# Patient Record
Sex: Female | Born: 1940 | Race: White | Hispanic: No | Marital: Single | Smoking: Never smoker
Health system: Southern US, Community
[De-identification: ages and names within clinical notes are randomized; demographics above are authoritative.]

## PROBLEM LIST (undated history)

## (undated) DIAGNOSIS — F419 Anxiety disorder, unspecified: Secondary | ICD-10-CM

## (undated) DIAGNOSIS — E785 Hyperlipidemia, unspecified: Secondary | ICD-10-CM

## (undated) DIAGNOSIS — H409 Unspecified glaucoma: Secondary | ICD-10-CM

## (undated) DIAGNOSIS — I2510 Atherosclerotic heart disease of native coronary artery without angina pectoris: Secondary | ICD-10-CM

## (undated) DIAGNOSIS — K219 Gastro-esophageal reflux disease without esophagitis: Secondary | ICD-10-CM

## (undated) DIAGNOSIS — C801 Malignant (primary) neoplasm, unspecified: Secondary | ICD-10-CM

## (undated) DIAGNOSIS — K589 Irritable bowel syndrome without diarrhea: Secondary | ICD-10-CM

## (undated) DIAGNOSIS — I10 Essential (primary) hypertension: Secondary | ICD-10-CM

## (undated) HISTORY — DX: Hyperlipidemia, unspecified: E78.5

## (undated) HISTORY — DX: Anxiety disorder, unspecified: F41.9

## (undated) HISTORY — PX: COLONOSCOPY: SHX174

## (undated) HISTORY — DX: Essential (primary) hypertension: I10

## (undated) HISTORY — DX: Atherosclerotic heart disease of native coronary artery without angina pectoris: I25.10

## (undated) HISTORY — DX: Gastro-esophageal reflux disease without esophagitis: K21.9

## (undated) HISTORY — DX: Unspecified glaucoma: H40.9

## (undated) HISTORY — DX: Malignant (primary) neoplasm, unspecified: C80.1

## (undated) HISTORY — DX: Irritable bowel syndrome, unspecified: K58.9

---

## 1974-12-27 HISTORY — PX: NASAL SINUS SURGERY: SHX719

## 2006-08-23 ENCOUNTER — Ambulatory Visit: Payer: Self-pay | Admitting: Gastroenterology

## 2006-08-30 ENCOUNTER — Ambulatory Visit: Payer: Self-pay | Admitting: Gastroenterology

## 2011-11-08 ENCOUNTER — Ambulatory Visit: Payer: Self-pay | Admitting: Family Medicine

## 2015-03-19 DIAGNOSIS — H40003 Preglaucoma, unspecified, bilateral: Secondary | ICD-10-CM | POA: Diagnosis not present

## 2015-10-01 ENCOUNTER — Ambulatory Visit (INDEPENDENT_AMBULATORY_CARE_PROVIDER_SITE_OTHER): Payer: Commercial Managed Care - HMO | Admitting: Family Medicine

## 2015-10-01 ENCOUNTER — Encounter: Payer: Self-pay | Admitting: Family Medicine

## 2015-10-01 VITALS — BP 156/81 | HR 66 | Temp 97.7°F | Ht 69.5 in | Wt 181.0 lb

## 2015-10-01 DIAGNOSIS — I10 Essential (primary) hypertension: Secondary | ICD-10-CM | POA: Diagnosis not present

## 2015-10-01 DIAGNOSIS — R748 Abnormal levels of other serum enzymes: Secondary | ICD-10-CM | POA: Diagnosis not present

## 2015-10-01 DIAGNOSIS — E785 Hyperlipidemia, unspecified: Secondary | ICD-10-CM

## 2015-10-01 DIAGNOSIS — C4490 Unspecified malignant neoplasm of skin, unspecified: Secondary | ICD-10-CM | POA: Diagnosis not present

## 2015-10-01 DIAGNOSIS — K219 Gastro-esophageal reflux disease without esophagitis: Secondary | ICD-10-CM | POA: Insufficient documentation

## 2015-10-01 DIAGNOSIS — H409 Unspecified glaucoma: Secondary | ICD-10-CM

## 2015-10-01 LAB — LP+ALT+AST PICCOLO, WAIVED
ALT (SGPT) Piccolo, Waived: 70 U/L — ABNORMAL HIGH (ref 10–47)
AST (SGOT) Piccolo, Waived: 85 U/L — ABNORMAL HIGH (ref 11–38)
Chol/HDL Ratio Piccolo,Waive: 4.1 mg/dL
Cholesterol Piccolo, Waived: 181 mg/dL (ref <200)
HDL CHOL PICCOLO, WAIVED: 45 mg/dL — AB (ref >59)
LDL CHOL CALC PICCOLO WAIVED: 110 mg/dL — AB (ref <100)
TRIGLYCERIDES PICCOLO,WAIVED: 134 mg/dL (ref <150)
VLDL CHOL CALC PICCOLO,WAIVE: 27 mg/dL (ref <30)

## 2015-10-01 MED ORDER — ALBUTEROL SULFATE HFA 108 (90 BASE) MCG/ACT IN AERS: 2.0000 | INHALATION_SPRAY | RESPIRATORY_TRACT | Status: DC | PRN

## 2015-10-01 MED ORDER — PRAVASTATIN SODIUM 40 MG PO TABS: 40.0000 mg | ORAL_TABLET | Freq: Every day | ORAL | Status: DC

## 2015-10-01 MED ORDER — BISOPROLOL-HYDROCHLOROTHIAZIDE 2.5-6.25 MG PO TABS: 2.0000 | ORAL_TABLET | Freq: Every day | ORAL | Status: DC

## 2015-10-01 MED ORDER — FENOFIBRATE 160 MG PO TABS: 160.0000 mg | ORAL_TABLET | Freq: Every day | ORAL | Status: DC

## 2015-10-01 NOTE — Assessment & Plan Note (Signed)
Followed at ophthalmology

## 2015-10-01 NOTE — Progress Notes (Signed)
BP 156/81 mmHg  Pulse 66  Temp(Src) 97.7 F (36.5 C)  Ht 5' 9.5" (1.765 m)  Wt 181 lb (82.101 kg)  BMI 26.35 kg/m2  SpO2 99%   Subjective:    Patient ID: Brianna Curry, female    DOB: [birth year], 74 y.o.   MRN: [id number]  HPI: Brianna Curry is a 74 y.o. female  Chief Complaint  Patient presents with  . Hyperlipidemia  . Hypertension  . needs referral for dermatology and eye  . itching spots   Patient follow-up for hypertension blood pressure checked yesterday was normal today is having a lot of stress with the granddaughter going into surgery for knees. Blood pressure ordinarily does well with no side effects takes medications faithfully.  Cholesterol doing well no complaints from Pravachol  Needs follow-up appointments for skin cancer on right cheek looked in chart both are old chart in [place] no mention of what kind of skin cancer Also needs follow-up appointment for glaucoma. These referrals will be made.  Relevant past medical, surgical, family and social history reviewed and updated as indicated. Interim medical history since our last visit reviewed. Allergies and medications reviewed and updated.  Review of Systems  Constitutional: Negative.   Respiratory: Negative.   Cardiovascular: Negative.     Per HPI unless specifically indicated above     Objective:    BP 156/81 mmHg  Pulse 66  Temp(Src) 97.7 F (36.5 C)  Ht 5' 9.5" (1.765 m)  Wt 181 lb (82.101 kg)  BMI 26.35 kg/m2  SpO2 99%  Wt Readings from Last 3 Encounters:  10/01/15 181 lb (82.101 kg)  11/11/14 178 lb (80.74 kg)    Physical Exam  Constitutional: She is oriented to person, place, and time. She appears well-developed and well-nourished. No distress.  HENT:  Head: Normocephalic and atraumatic.  Right Ear: Hearing normal.  Left Ear: Hearing normal.  Nose: Nose normal.  Eyes: Conjunctivae and lids are normal. Right eye exhibits no discharge. Left eye exhibits no discharge. No scleral  icterus.  Cardiovascular: Normal rate, regular rhythm and normal heart sounds.   Pulmonary/Chest: Effort normal and breath sounds normal. No respiratory distress.  Musculoskeletal: Normal range of motion.  Neurological: She is alert and oriented to person, place, and time.  Skin: Skin is intact. No rash noted.  Psychiatric: She has a normal mood and affect. Her speech is normal and behavior is normal. Judgment and thought content normal. Cognition and memory are normal.    No results found for this or any previous visit.    Assessment & Plan:   Problem List Items Addressed This Visit      Cardiovascular and Mediastinum   Hypertension    The current medical regimen is effective;  continue present plan and medications.       Relevant Medications   pravastatin (PRAVACHOL) 40 MG tablet   fenofibrate 160 MG tablet   bisoprolol-hydrochlorothiazide (ZIAC) 2.5-6.25 MG tablet     Other   Hyperlipidemia    The current medical regimen is effective;  continue present plan and medications.       Relevant Medications   pravastatin (PRAVACHOL) 40 MG tablet   fenofibrate 160 MG tablet   bisoprolol-hydrochlorothiazide (ZIAC) 2.5-6.25 MG tablet   Elevated liver enzymes    Patient with chronically elevated liver enzymes previous hepatitis B and C testing was negative Patient's enzymes stable but up slightly. We will recheck at physical this winter.      Glaucoma  Followed at ophthalmology      Relevant Medications   latanoprost (XALATAN) 0.005 % ophthalmic solution   Other Relevant Orders   Ambulatory referral to Ophthalmology    Other Visit Diagnoses    Essential hypertension, benign    -  Primary    Relevant Medications    pravastatin (PRAVACHOL) 40 MG tablet    fenofibrate 160 MG tablet    bisoprolol-hydrochlorothiazide (ZIAC) 2.5-6.25 MG tablet    Other Relevant Orders    LP+ALT+AST Piccolo, Waived    Basic metabolic panel    Hyperlipemia        Relevant Medications     pravastatin (PRAVACHOL) 40 MG tablet    fenofibrate 160 MG tablet    bisoprolol-hydrochlorothiazide (ZIAC) 2.5-6.25 MG tablet    Other Relevant Orders    LP+ALT+AST Piccolo, Waived    Basic metabolic panel    Skin cancer        Relevant Orders    Ambulatory referral to Dermatology        Follow up plan: Return for Physical Exam.

## 2015-10-01 NOTE — Assessment & Plan Note (Signed)
The current medical regimen is effective;  continue present plan and medications.  

## 2015-10-01 NOTE — Assessment & Plan Note (Signed)
Patient with chronically elevated liver enzymes previous hepatitis B and C testing was negative Patient's enzymes stable but up slightly. We will recheck at physical this winter.

## 2015-10-02 ENCOUNTER — Encounter: Payer: Self-pay | Admitting: Family Medicine

## 2015-10-02 LAB — BASIC METABOLIC PANEL
BUN/Creatinine Ratio: 13 (ref 11–26)
BUN: 11 mg/dL (ref 8–27)
CO2: 23 mmol/L (ref 18–29)
CREATININE: 0.87 mg/dL (ref 0.57–1.00)
Calcium: 9.6 mg/dL (ref 8.7–10.3)
Chloride: 101 mmol/L (ref 97–108)
GFR calc Af Amer: 75 mL/min/{1.73_m2} (ref >59)
GFR calc non Af Amer: 65 mL/min/{1.73_m2} (ref >59)
GLUCOSE: 113 mg/dL — AB (ref 65–99)
Potassium: 4.5 mmol/L (ref 3.5–5.2)
SODIUM: 140 mmol/L (ref 134–144)

## 2015-11-24 DIAGNOSIS — Z85828 Personal history of other malignant neoplasm of skin: Secondary | ICD-10-CM | POA: Diagnosis not present

## 2015-11-24 DIAGNOSIS — Z08 Encounter for follow-up examination after completed treatment for malignant neoplasm: Secondary | ICD-10-CM | POA: Diagnosis not present

## 2015-11-24 DIAGNOSIS — L570 Actinic keratosis: Secondary | ICD-10-CM | POA: Diagnosis not present

## 2015-11-24 DIAGNOSIS — Z1283 Encounter for screening for malignant neoplasm of skin: Secondary | ICD-10-CM | POA: Diagnosis not present

## 2016-01-14 DIAGNOSIS — H2513 Age-related nuclear cataract, bilateral: Secondary | ICD-10-CM | POA: Diagnosis not present

## 2016-03-26 ENCOUNTER — Other Ambulatory Visit: Payer: Self-pay | Admitting: Family Medicine

## 2016-04-01 ENCOUNTER — Other Ambulatory Visit: Payer: Self-pay | Admitting: Family Medicine

## 2016-05-17 ENCOUNTER — Encounter: Payer: Self-pay | Admitting: Family Medicine

## 2016-05-17 ENCOUNTER — Ambulatory Visit (INDEPENDENT_AMBULATORY_CARE_PROVIDER_SITE_OTHER): Payer: Commercial Managed Care - HMO | Admitting: Family Medicine

## 2016-05-17 VITALS — BP 133/84 | HR 89 | Temp 97.7°F | Ht 70.1 in | Wt 175.0 lb

## 2016-05-17 DIAGNOSIS — Z23 Encounter for immunization: Secondary | ICD-10-CM

## 2016-05-17 DIAGNOSIS — E785 Hyperlipidemia, unspecified: Secondary | ICD-10-CM

## 2016-05-17 DIAGNOSIS — T148 Other injury of unspecified body region: Secondary | ICD-10-CM

## 2016-05-17 DIAGNOSIS — I10 Essential (primary) hypertension: Secondary | ICD-10-CM | POA: Diagnosis not present

## 2016-05-17 DIAGNOSIS — Z0000 Encounter for general adult medical examination without abnormal findings: Secondary | ICD-10-CM | POA: Diagnosis not present

## 2016-05-17 DIAGNOSIS — W57XXXA Bitten or stung by nonvenomous insect and other nonvenomous arthropods, initial encounter: Secondary | ICD-10-CM | POA: Diagnosis not present

## 2016-05-17 LAB — MICROSCOPIC EXAMINATION

## 2016-05-17 LAB — URINALYSIS, ROUTINE W REFLEX MICROSCOPIC
Bilirubin, UA: NEGATIVE
Glucose, UA: NEGATIVE
KETONES UA: NEGATIVE
NITRITE UA: NEGATIVE
Protein, UA: NEGATIVE
RBC UA: NEGATIVE
SPEC GRAV UA: 1.02 (ref 1.005–1.030)
Urobilinogen, Ur: 0.2 mg/dL (ref 0.2–1.0)
pH, UA: 5 (ref 5.0–7.5)

## 2016-05-17 MED ORDER — BISOPROLOL-HYDROCHLOROTHIAZIDE 2.5-6.25 MG PO TABS: 1.0000 | ORAL_TABLET | Freq: Every day | ORAL | Status: DC

## 2016-05-17 MED ORDER — DOXYCYCLINE HYCLATE 100 MG PO TABS: 100.0000 mg | ORAL_TABLET | Freq: Two times a day (BID) | ORAL | Status: DC

## 2016-05-17 MED ORDER — PRAVASTATIN SODIUM 40 MG PO TABS: 40.0000 mg | ORAL_TABLET | Freq: Every day | ORAL | Status: DC

## 2016-05-17 MED ORDER — FENOFIBRATE 160 MG PO TABS: 160.0000 mg | ORAL_TABLET | Freq: Every day | ORAL | Status: DC

## 2016-05-17 NOTE — Progress Notes (Signed)
BP 133/84 mmHg  Pulse 89  Temp(Src) 97.7 F (36.5 C)  Ht 5' 10.1" (1.781 m)  Wt 175 lb (79.379 kg)  BMI 25.03 kg/m2  SpO2 96%   Subjective:    Patient ID: Brianna Curry, female    DOB: [birth year], 75 y.o.   MRN: [id number]  HPI: Brianna Curry is a 75 y.o. female  Chief Complaint  Patient presents with  . Annual Exam  Concerned about multiple tick exposures and 3 with with target lesions on her leg ends back area. This been present about 2 weeks rash is starting to fade now. doing well with cholesterol medicine and blood pressure medicine good control of blood pressure with no issues no side effects and takes medicines faithfully used  Relevant past medical, surgical, family and social history reviewed and updated as indicated. Interim medical history since our last visit reviewed. Allergies and medications reviewed and updated.  Review of Systems  Constitutional: Negative.   HENT: Negative.   Eyes: Negative.   Respiratory: Negative.   Cardiovascular: Negative.   Gastrointestinal: Negative.   Endocrine: Negative.   Genitourinary: Negative.   Musculoskeletal: Negative.   Skin: Negative.   Allergic/Immunologic: Negative.   Neurological: Negative.   Hematological: Negative.   Psychiatric/Behavioral: Negative.     Per HPI unless specifically indicated above     Objective:    BP 133/84 mmHg  Pulse 89  Temp(Src) 97.7 F (36.5 C)  Ht 5' 10.1" (1.781 m)  Wt 175 lb (79.379 kg)  BMI 25.03 kg/m2  SpO2 96%  Wt Readings from Last 3 Encounters:  05/17/16 175 lb (79.379 kg)  10/01/15 181 lb (82.101 kg)  11/11/14 178 lb (80.74 kg)    Physical Exam  Constitutional: She is oriented to person, place, and time. She appears well-developed and well-nourished.  HENT:  Head: Normocephalic and atraumatic.  Right Ear: External ear normal.  Left Ear: External ear normal.  Nose: Nose normal.  Mouth/Throat: Oropharynx is clear and moist.  Eyes: Conjunctivae and EOM are  normal. Pupils are equal, round, and reactive to light.  Neck: Normal range of motion. Neck supple. Carotid bruit is not present.  Cardiovascular: Normal rate, regular rhythm and normal heart sounds.   No murmur heard. Pulmonary/Chest: Effort normal and breath sounds normal. She exhibits no mass. Right breast exhibits no mass, no skin change and no tenderness. Left breast exhibits no mass, no skin change and no tenderness. Breasts are symmetrical.  Abdominal: Soft. Bowel sounds are normal. There is no hepatosplenomegaly.  Musculoskeletal: Normal range of motion.  Neurological: She is alert and oriented to person, place, and time.  Skin: No rash noted.  Psychiatric: She has a normal mood and affect. Her behavior is normal. Judgment and thought content normal.    Results for orders placed or performed in visit on 10/01/15  LP+ALT+AST Piccolo, [employer]  Result Value Ref Range   ALT (SGPT) Piccolo, Waived 70 (H) 10 - 47 U/L   AST (SGOT) Piccolo, Waived 85 (H) 11 - 38 U/L   Cholesterol Piccolo, Waived 181 <200 mg/dL   HDL Chol Piccolo, Waived 45 (L) >59 mg/dL   Triglycerides Piccolo,Waived 134 <150 mg/dL   Chol/HDL Ratio Piccolo,Waive 4.1 mg/dL   LDL Chol Calc Piccolo Waived 110 (H) <100 mg/dL   VLDL Chol Calc Piccolo,Waive 27 <30 mg/dL  Basic metabolic panel  Result Value Ref Range   Glucose 113 (H) 65 - 99 mg/dL   BUN 11 8 - 27 mg/dL  Creatinine, Ser 0.87 0.57 - 1.00 mg/dL   GFR calc non Af Amer 65 >59 mL/min/1.73   GFR calc Af Amer 75 >59 mL/min/1.73   BUN/Creatinine Ratio 13 11 - 26   Sodium 140 134 - 144 mmol/L   Potassium 4.5 3.5 - 5.2 mmol/L   Chloride 101 97 - 108 mmol/L   CO2 23 18 - 29 mmol/L   Calcium 9.6 8.7 - 10.3 mg/dL      Assessment & Plan:   Problem List Items Addressed This Visit      Cardiovascular and Mediastinum   Hypertension    The current medical regimen is effective;  continue present plan and medications.       Relevant Medications    bisoprolol-hydrochlorothiazide (ZIAC) 2.5-6.25 MG tablet   fenofibrate 160 MG tablet   pravastatin (PRAVACHOL) 40 MG tablet     Other   Hyperlipidemia    The current medical regimen is effective;  continue present plan and medications.       Relevant Medications   bisoprolol-hydrochlorothiazide (ZIAC) 2.5-6.25 MG tablet   fenofibrate 160 MG tablet   pravastatin (PRAVACHOL) 40 MG tablet    Other Visit Diagnoses    Immunization due    -  Primary    Relevant Orders    Pneumococcal conjugate vaccine 13-valent IM (Completed)    Routine general medical examination at a health care facility        Relevant Orders    CBC with Differential/Platelet    Comprehensive metabolic panel    Lipid Panel w/o Chol/HDL Ratio    TSH    Urinalysis, Routine w reflex microscopic (not at [facility])    Tick bite        Reviewed prevention of tick bites because of potential for Lyme's disease will treat with doxycycline for 2 weeks patient ed on sunburn        Follow up plan: Return in about 6 months (around 11/17/2016) for lipids, alt, ast, bmp.

## 2016-05-17 NOTE — Assessment & Plan Note (Signed)
The current medical regimen is effective;  continue present plan and medications.  

## 2016-05-17 NOTE — Patient Instructions (Signed)
Pneumococcal Conjugate Vaccine (PCV13)   1. Why get vaccinated?  Vaccination can protect both children and adults from pneumococcal disease.  Pneumococcal disease is caused by bacteria that can spread from person to person through close contact. It can cause ear infections, and it can also lead to more serious infections of the:  · Lungs (pneumonia),  · Blood (bacteremia), and  · Covering of the brain and spinal cord (meningitis).  Pneumococcal pneumonia is most common among adults. Pneumococcal meningitis can cause deafness and brain damage, and it kills about 1 child in 10 who get it.  Anyone can get pneumococcal disease, but children under 2 years of age and adults 65 years and older, people with certain medical conditions, and cigarette smokers are at the highest risk.  Before there was a vaccine, the [place] saw:  · more than 700 cases of meningitis,  · about 13,000 blood infections,  · about 5 million ear infections, and  · about 200 deaths  in children under 5 each year from pneumococcal disease. Since vaccine became available, severe pneumococcal disease in these children has fallen by 88%.  About 18,000 older adults die of pneumococcal disease each year in the [place].  Treatment of pneumococcal infections with penicillin and other drugs is not as effective as it used to be, because some strains of the disease have become resistant to these drugs. This makes prevention of the disease, through vaccination, even more important.  2. PCV13 vaccine  Pneumococcal conjugate vaccine (called PCV13) protects against 13 types of pneumococcal bacteria.  PCV13 is routinely given to children at 2, 4, 6, and 12-15 months of age. It is also recommended for children and adults 2 to 64 years of age with certain health conditions, and for all adults 65 years of age and older. Your doctor can give you details.  3. Some people should not get this vaccine  Anyone who has ever had a life-threatening allergic reaction  to a dose of this vaccine, to an earlier pneumococcal vaccine called PCV7, or to any vaccine containing diphtheria toxoid (for example, DTaP), should not get PCV13.  Anyone with a severe allergy to any component of PCV13 should not get the vaccine. Tell your doctor if the person being vaccinated has any severe allergies.  If the person scheduled for vaccination is not feeling well, your healthcare provider might decide to reschedule the shot on another day.  4. Risks of a vaccine reaction  With any medicine, including vaccines, there is a chance of reactions. These are usually mild and go away on their own, but serious reactions are also possible.  Problems reported following PCV13 varied by age and dose in the series. The most common problems reported among children were:  · About half became drowsy after the shot, had a temporary loss of appetite, or had redness or tenderness where the shot was given.  · About 1 out of 3 had swelling where the shot was given.  · About 1 out of 3 had a mild fever, and about 1 in 20 had a fever over 102.2°F.  · Up to about 8 out of 10 became fussy or irritable.  Adults have reported pain, redness, and swelling where the shot was given; also mild fever, fatigue, headache, chills, or muscle pain.  Young children who get PCV13 along with inactivated flu vaccine at the same time may be at increased risk for seizures caused by fever. Ask your doctor for more information.  Problems that   could happen after any vaccine:  · People sometimes faint after a medical procedure, including vaccination. Sitting or lying down for about 15 minutes can help prevent fainting, and injuries caused by a fall. Tell your doctor if you feel dizzy, or have vision changes or ringing in the ears.  · Some older children and adults get severe pain in the shoulder and have difficulty moving the arm where a shot was given. This happens very rarely.  · Any medication can cause a severe allergic reaction. Such  reactions from a vaccine are very rare, estimated at about 1 in a million doses, and would happen within a few minutes to a few hours after the vaccination.  As with any medicine, there is a very small chance of a vaccine causing a serious injury or death.  The safety of vaccines is always being monitored. For more information, visit: www.cdc.gov/vaccinesafety/  5. What if there is a serious reaction?  What should I look for?  · Look for anything that concerns you, such as signs of a severe allergic reaction, very high fever, or unusual behavior.  Signs of a severe allergic reaction can include hives, swelling of the face and throat, difficulty breathing, a fast heartbeat, dizziness, and weakness-usually within a few minutes to a few hours after the vaccination.  What should I do?  · If you think it is a severe allergic reaction or other emergency that can't wait, call 9-1-1 or get the person to the nearest hospital. Otherwise, call your doctor.  Reactions should be reported to the Vaccine Adverse Event Reporting System (VAERS). Your doctor should file this report, or you can do it yourself through the VAERS web site at www.vaers.hhs.gov, or by calling 1-800-822-7967.  VAERS does not give medical advice.  6. The National Vaccine Injury Compensation Program  The National Vaccine Injury Compensation Program (VICP) is a federal program that was created to compensate people who may have been injured by certain vaccines.  Persons who believe they may have been injured by a vaccine can learn about the program and about filing a claim by calling 1-800-338-2382 or visiting the VICP website at www.hrsa.gov/vaccinecompensation. There is a time limit to file a claim for compensation.  7. How can I learn more?  · Ask your healthcare provider. He or she can give you the vaccine package insert or suggest other sources of information.  · Call your local or state health department.  · Contact the Centers for Disease Control and  Prevention (CDC):    Call 1-800-232-4636 (1-800-CDC-INFO) or    Visit CDC's website at www.cdc.gov/vaccines  Vaccine Information Statement  PCV13 Vaccine (10/31/2014)     This information is not intended to replace advice given to you by your health care provider. Make sure you discuss any questions you have with your health care provider.     Document Released: 10/10/2006 Document Revised: 01/03/2015 Document Reviewed: 11/07/2014  Elsevier Interactive Patient Education ©2016 Elsevier Inc.

## 2016-05-18 ENCOUNTER — Encounter: Payer: Self-pay | Admitting: Family Medicine

## 2016-05-18 LAB — COMPREHENSIVE METABOLIC PANEL
ALBUMIN: 4.8 g/dL (ref 3.5–4.8)
ALT: 44 IU/L — ABNORMAL HIGH (ref 0–32)
AST: 58 IU/L — ABNORMAL HIGH (ref 0–40)
Albumin/Globulin Ratio: 1.9 (ref 1.2–2.2)
Alkaline Phosphatase: 38 IU/L — ABNORMAL LOW (ref 39–117)
BUN / CREAT RATIO: 20 (ref 12–28)
BUN: 14 mg/dL (ref 8–27)
Bilirubin Total: 0.5 mg/dL (ref 0.0–1.2)
CO2: 24 mmol/L (ref 18–29)
CREATININE: 0.71 mg/dL (ref 0.57–1.00)
Calcium: 9.8 mg/dL (ref 8.7–10.3)
Chloride: 102 mmol/L (ref 96–106)
GFR, EST AFRICAN AMERICAN: 96 mL/min/{1.73_m2} (ref >59)
GFR, EST NON AFRICAN AMERICAN: 83 mL/min/{1.73_m2} (ref >59)
GLOBULIN, TOTAL: 2.5 g/dL (ref 1.5–4.5)
GLUCOSE: 105 mg/dL — AB (ref 65–99)
Potassium: 5 mmol/L (ref 3.5–5.2)
Sodium: 143 mmol/L (ref 134–144)
TOTAL PROTEIN: 7.3 g/dL (ref 6.0–8.5)

## 2016-05-18 LAB — CBC WITH DIFFERENTIAL/PLATELET
BASOS ABS: 0 10*3/uL (ref 0.0–0.2)
Basos: 1 %
EOS (ABSOLUTE): 0.3 10*3/uL (ref 0.0–0.4)
EOS: 5 %
HEMATOCRIT: 43 % (ref 34.0–46.6)
HEMOGLOBIN: 14.3 g/dL (ref 11.1–15.9)
IMMATURE GRANS (ABS): 0 10*3/uL (ref 0.0–0.1)
Immature Granulocytes: 0 %
LYMPHS: 46 %
Lymphocytes Absolute: 2.4 10*3/uL (ref 0.7–3.1)
MCH: 29.2 pg (ref 26.6–33.0)
MCHC: 33.3 g/dL (ref 31.5–35.7)
MCV: 88 fL (ref 79–97)
MONOCYTES: 9 %
Monocytes Absolute: 0.5 10*3/uL (ref 0.1–0.9)
NEUTROS ABS: 2.1 10*3/uL (ref 1.4–7.0)
Neutrophils: 39 %
Platelets: 231 10*3/uL (ref 150–379)
RBC: 4.9 x10E6/uL (ref 3.77–5.28)
RDW: 14.2 % (ref 12.3–15.4)
WBC: 5.3 10*3/uL (ref 3.4–10.8)

## 2016-05-18 LAB — LIPID PANEL W/O CHOL/HDL RATIO
CHOLESTEROL TOTAL: 192 mg/dL (ref 100–199)
HDL: 48 mg/dL (ref >39)
LDL Calculated: 118 mg/dL — ABNORMAL HIGH (ref 0–99)
TRIGLYCERIDES: 128 mg/dL (ref 0–149)
VLDL Cholesterol Cal: 26 mg/dL (ref 5–40)

## 2016-05-18 LAB — TSH: TSH: 2.23 u[IU]/mL (ref 0.450–4.500)

## 2016-05-20 ENCOUNTER — Telehealth: Payer: Self-pay | Admitting: Family Medicine

## 2016-05-20 MED ORDER — BISOPROLOL-HYDROCHLOROTHIAZIDE 2.5-6.25 MG PO TABS: 2.0000 | ORAL_TABLET | Freq: Every day | ORAL | Status: DC

## 2016-05-20 NOTE — Telephone Encounter (Signed)
Pt called and stated that the dosage was incorrect with her bisoprolol-hydrochlorothiazide (ZIAC) 2.5-6.25 MG tablet and she would like it to be corrected and sent to  [place].

## 2016-06-14 ENCOUNTER — Encounter: Payer: Self-pay | Admitting: Family Medicine

## 2016-06-14 ENCOUNTER — Telehealth: Payer: Self-pay

## 2016-06-14 ENCOUNTER — Ambulatory Visit (INDEPENDENT_AMBULATORY_CARE_PROVIDER_SITE_OTHER): Payer: Commercial Managed Care - HMO | Admitting: Family Medicine

## 2016-06-14 VITALS — BP 147/79 | HR 78 | Temp 97.7°F | Ht 70.1 in | Wt 176.0 lb

## 2016-06-14 DIAGNOSIS — N390 Urinary tract infection, site not specified: Secondary | ICD-10-CM | POA: Diagnosis not present

## 2016-06-14 LAB — MICROSCOPIC EXAMINATION

## 2016-06-14 LAB — URINALYSIS, ROUTINE W REFLEX MICROSCOPIC
BILIRUBIN UA: NEGATIVE
GLUCOSE, UA: NEGATIVE
KETONES UA: NEGATIVE
Nitrite, UA: NEGATIVE
PH UA: 7 (ref 5.0–7.5)
Protein, UA: NEGATIVE
RBC UA: NEGATIVE
Specific Gravity, UA: 1.02 (ref 1.005–1.030)
Urobilinogen, Ur: 0.2 mg/dL (ref 0.2–1.0)

## 2016-06-14 MED ORDER — CIPROFLOXACIN HCL 250 MG PO TABS: 250.0000 mg | ORAL_TABLET | Freq: Two times a day (BID) | ORAL | Status: DC

## 2016-06-14 NOTE — Telephone Encounter (Signed)
Pt added to your schedule for 2pm. Thanks.

## 2016-06-14 NOTE — Telephone Encounter (Signed)
Patient states that she was treated for Lyme disease several weeks ago and still has a bump in the area that the tick was found. Patient also states that she thinks she has a UTI and really feels that she needs to be seen by Dr. Jeananne Rama today. I explained to the patient that Dr. Jeananne Rama did not have any appts available today but patient stated that she does not drive and that today is the only day her sister is available. Patient requested that I send a message to Dr. Jeananne Rama to see if he is willing to work her in an appt today because she had a rough night and feels so badly. Patient can be reached at [phone].

## 2016-06-14 NOTE — Progress Notes (Signed)
BP 147/79 mmHg  Pulse 78  Temp(Src) 97.7 F (36.5 C)  Ht 5' 10.1" (1.781 m)  Wt 176 lb (79.833 kg)  BMI 25.17 kg/m2  SpO2 99%   Subjective:    Patient ID: Brianna Curry, female    DOB: Oct 31, 1941, 74 y.o.   MRN: [id number]  HPI: [name] is a 75 y.o. female  Chief Complaint  Patient presents with  . Urinary Tract Infection  Patient presents with 4 day history of RLQ abdominal pain wrapping around the flank, dysuria, urinary frequency, urgency, and nausea. States she just completed a 15 day course of doxycycline for Lyme Disease, and it is common for her to have UTIs following antibiotic use. Symptoms are worsening. Has been taking AZO for several weeks in anticipation of a UTI, but symptoms are not improving. Denies fevers, chills, sweats, hematuria, bowel changes, or vomiting.   Relevant past medical, surgical, family and social history reviewed and updated as indicated. Interim medical history since our last visit reviewed. Allergies and medications reviewed and updated.  Review of Systems  Constitutional: Negative.   Respiratory: Negative.   Cardiovascular: Negative.     Per HPI unless specifically indicated above     Objective:    BP 147/79 mmHg  Pulse 78  Temp(Src) 97.7 F (36.5 C)  Ht 5' 10.1" (1.781 m)  Wt 176 lb (79.833 kg)  BMI 25.17 kg/m2  SpO2 99%  Wt Readings from Last 3 Encounters:  06/14/16 176 lb (79.833 kg)  05/17/16 175 lb (79.379 kg)  10/01/15 181 lb (82.101 kg)    Physical Exam  Constitutional: She is oriented to person, place, and time. She appears well-developed and well-nourished. No distress.  HENT:  Head: Normocephalic and atraumatic.  Right Ear: Hearing normal.  Left Ear: Hearing normal.  Nose: Nose normal.  Eyes: Conjunctivae and lids are normal. Right eye exhibits no discharge. Left eye exhibits no discharge. No scleral icterus.  Pulmonary/Chest: Effort normal. No respiratory distress.  Abdominal: There is tenderness.   Musculoskeletal: Normal range of motion.  Neurological: She is alert and oriented to person, place, and time.  Skin: Skin is intact. No rash noted.  Psychiatric: She has a normal mood and affect. Her speech is normal and behavior is normal. Judgment and thought content normal. Cognition and memory are normal.    Results for orders placed or performed in visit on 05/17/16  Microscopic Examination  Result Value Ref Range   WBC, UA 0-5 0 -  5 /hpf   RBC, UA 0-2 0 -  2 /hpf   Epithelial Cells (non renal) 0-10 0 - 10 /hpf   Mucus, UA Present Not Estab.   Bacteria, UA Few None seen/Few  CBC with Differential/Platelet  Result Value Ref Range   WBC 5.3 3.4 - 10.8 x10E3/uL   RBC 4.90 3.77 - 5.28 x10E6/uL   Hemoglobin 14.3 11.1 - 15.9 g/dL   Hematocrit 43.0 34.0 - 46.6 %   MCV 88 79 - 97 fL   MCH 29.2 26.6 - 33.0 pg   MCHC 33.3 31.5 - 35.7 g/dL   RDW 14.2 12.3 - 15.4 %   Platelets 231 150 - 379 x10E3/uL   Neutrophils 39 %   Lymphs 46 %   Monocytes 9 %   Eos 5 %   Basos 1 %   Neutrophils Absolute 2.1 1.4 - 7.0 x10E3/uL   Lymphocytes Absolute 2.4 0.7 - 3.1 x10E3/uL   Monocytes Absolute 0.5 0.1 - 0.9 x10E3/uL  EOS (ABSOLUTE) 0.3 0.0 - 0.4 x10E3/uL   Basophils Absolute 0.0 0.0 - 0.2 x10E3/uL   Immature Granulocytes 0 %   Immature Grans (Abs) 0.0 0.0 - 0.1 x10E3/uL  Comprehensive metabolic panel  Result Value Ref Range   Glucose 105 (H) 65 - 99 mg/dL   BUN 14 8 - 27 mg/dL   Creatinine, Ser 0.71 0.57 - 1.00 mg/dL   GFR calc non Af Amer 83 >59 mL/min/1.73   GFR calc Af Amer 96 >59 mL/min/1.73   BUN/Creatinine Ratio 20 12 - 28   Sodium 143 134 - 144 mmol/L   Potassium 5.0 3.5 - 5.2 mmol/L   Chloride 102 96 - 106 mmol/L   CO2 24 18 - 29 mmol/L   Calcium 9.8 8.7 - 10.3 mg/dL   Total Protein 7.3 6.0 - 8.5 g/dL   Albumin 4.8 3.5 - 4.8 g/dL   Globulin, Total 2.5 1.5 - 4.5 g/dL   Albumin/Globulin Ratio 1.9 1.2 - 2.2   Bilirubin Total 0.5 0.0 - 1.2 mg/dL   Alkaline Phosphatase 38 (L)  39 - 117 IU/L   AST 58 (H) 0 - 40 IU/L   ALT 44 (H) 0 - 32 IU/L  Lipid Panel w/o Chol/HDL Ratio  Result Value Ref Range   Cholesterol, Total 192 100 - 199 mg/dL   Triglycerides 128 0 - 149 mg/dL   HDL 48 >39 mg/dL   VLDL Cholesterol Cal 26 5 - 40 mg/dL   LDL Calculated 118 (H) 0 - 99 mg/dL  TSH  Result Value Ref Range   TSH 2.230 0.450 - 4.500 uIU/mL  Urinalysis, Routine w reflex microscopic (not at [facility])  Result Value Ref Range   Specific Gravity, UA 1.020 1.005 - 1.030   pH, UA 5.0 5.0 - 7.5   Color, UA Yellow Yellow   Appearance Ur Clear Clear   Leukocytes, UA Trace (A) Negative   Protein, UA Negative Negative/Trace   Glucose, UA Negative Negative   Ketones, UA Negative Negative   RBC, UA Negative Negative   Bilirubin, UA Negative Negative   Urobilinogen, Ur 0.2 0.2 - 1.0 mg/dL   Nitrite, UA Negative Negative   Microscopic Examination See below:       Assessment & Plan:   Problem List Items Addressed This Visit    None    Visit Diagnoses    UTI (lower urinary tract infection)    -  Primary    Discuss UTI care and treatment increase fluids water use of antibiotics Will get urine culture and sensitivity and use of Tylenol    Relevant Orders    Urinalysis, Routine w reflex microscopic (not at [facility])    Urine culture    Urinalysis, Routine w reflex microscopic (not at [facility])        Patient still on a Azo not sure if culture will be useful. Follow up plan: Return for As scheduled.

## 2016-06-16 LAB — URINE CULTURE

## 2016-11-20 ENCOUNTER — Emergency Department: Payer: Commercial Managed Care - HMO

## 2016-11-20 ENCOUNTER — Emergency Department
Admission: EM | Admit: 2016-11-20 | Discharge: 2016-11-20 | Disposition: A | Discharge status: Home / Self Care | Payer: Commercial Managed Care - HMO | Attending: Emergency Medicine | Admitting: Emergency Medicine

## 2016-11-20 ENCOUNTER — Encounter: Payer: Self-pay | Admitting: Emergency Medicine

## 2016-11-20 DIAGNOSIS — I10 Essential (primary) hypertension: Secondary | ICD-10-CM | POA: Diagnosis not present

## 2016-11-20 DIAGNOSIS — Z79899 Other long term (current) drug therapy: Secondary | ICD-10-CM | POA: Insufficient documentation

## 2016-11-20 DIAGNOSIS — Z7982 Long term (current) use of aspirin: Secondary | ICD-10-CM | POA: Diagnosis not present

## 2016-11-20 DIAGNOSIS — R609 Edema, unspecified: Secondary | ICD-10-CM

## 2016-11-20 DIAGNOSIS — M7989 Other specified soft tissue disorders: Secondary | ICD-10-CM | POA: Diagnosis not present

## 2016-11-20 DIAGNOSIS — L03113 Cellulitis of right upper limb: Secondary | ICD-10-CM | POA: Insufficient documentation

## 2016-11-20 DIAGNOSIS — S60561A Insect bite (nonvenomous) of right hand, initial encounter: Secondary | ICD-10-CM | POA: Diagnosis not present

## 2016-11-20 DIAGNOSIS — R2231 Localized swelling, mass and lump, right upper limb: Secondary | ICD-10-CM | POA: Diagnosis not present

## 2016-11-20 DIAGNOSIS — W57XXXA Bitten or stung by nonvenomous insect and other nonvenomous arthropods, initial encounter: Secondary | ICD-10-CM | POA: Diagnosis not present

## 2016-11-20 DIAGNOSIS — T7840XA Allergy, unspecified, initial encounter: Secondary | ICD-10-CM | POA: Diagnosis not present

## 2016-11-20 LAB — BASIC METABOLIC PANEL
ANION GAP: 10 (ref 5–15)
BUN: 21 mg/dL — ABNORMAL HIGH (ref 6–20)
CALCIUM: 9.7 mg/dL (ref 8.9–10.3)
CO2: 20 mmol/L — AB (ref 22–32)
CREATININE: 1.22 mg/dL — AB (ref 0.44–1.00)
Chloride: 104 mmol/L (ref 101–111)
GFR, EST AFRICAN AMERICAN: 49 mL/min — AB (ref >60)
GFR, EST NON AFRICAN AMERICAN: 42 mL/min — AB (ref >60)
GLUCOSE: 305 mg/dL — AB (ref 65–99)
Potassium: 3.7 mmol/L (ref 3.5–5.1)
Sodium: 134 mmol/L — ABNORMAL LOW (ref 135–145)

## 2016-11-20 LAB — CBC
HEMATOCRIT: 46 % (ref 35.0–47.0)
Hemoglobin: 15.7 g/dL (ref 12.0–16.0)
MCH: 30.4 pg (ref 26.0–34.0)
MCHC: 34.2 g/dL (ref 32.0–36.0)
MCV: 88.9 fL (ref 80.0–100.0)
PLATELETS: 218 10*3/uL (ref 150–440)
RBC: 5.17 MIL/uL (ref 3.80–5.20)
RDW: 13.4 % (ref 11.5–14.5)
WBC: 7.6 10*3/uL (ref 3.6–11.0)

## 2016-11-20 MED ORDER — VANCOMYCIN HCL IN DEXTROSE 1-5 GM/200ML-% IV SOLN
1000.0000 mg | Freq: Once | INTRAVENOUS | Status: AC
  Administered 2016-11-20: 1000 mg via INTRAVENOUS
  Filled 2016-11-20: qty 200

## 2016-11-20 MED ORDER — CEPHALEXIN 500 MG PO CAPS: 500.0000 mg | ORAL_CAPSULE | Freq: Three times a day (TID) | ORAL | 0 refills | Status: DC

## 2016-11-20 MED ORDER — DIPHENHYDRAMINE HCL 25 MG PO CAPS
25.0000 mg | ORAL_CAPSULE | Freq: Once | ORAL | Status: AC
  Administered 2016-11-20: 25 mg via ORAL
  Filled 2016-11-20: qty 1

## 2016-11-20 NOTE — ED Notes (Signed)
Pt is in good condition; discharge instructions reviewed, follow up care and home care reviewed; prescription medication reviewed; pt verbalized understanding; pt is ambulatory and went home with a friend.

## 2016-11-20 NOTE — ED Triage Notes (Signed)
Pt presents with swelling, pain and itching to right hand. Pt reports red streaks radiating up arm. Pt states seen at [facility] yesterday for insect bite and given steroids but the symptoms have gotten worse.

## 2016-11-20 NOTE — ED Provider Notes (Addendum)
[facility] Emergency Department Provider Note  Time seen: 9:15 PM  I have reviewed the triage vital signs and the nursing notes.   HISTORY  Chief Complaint Insect Bite and Cellulitis    HPI Brianna Curry is a 75 y.o. female with a past medical history of hypertension, hyperlipidemia, presents the emergency department for right arm swelling and redness. According to the patient she was working in her garden yesterday when she felt either a bite or a pruritic to her right hand. States she did not think much of it was hurting her a little bit overnight however she awoke around 1 or 2:00 in the morning and found that her right hand is very swollen. States the swelling and redness had continued Sr. went to urgent care today. They diagnosed with likely allergic reaction and gave her a shot of steroids. Patient states it has continued to bother her so she came to the emergency department for evaluation. Denies any history of blood clots in the past. Denies any fever or vomiting.  Past Medical History:  Diagnosis Date  . Anxiety   . GERD (gastroesophageal reflux disease)   . Glaucoma   . Hyperlipidemia   . Hypertension   . IBS (irritable bowel syndrome)     Patient Active Problem List   Diagnosis Date Noted  . Elevated liver enzymes 10/01/2015  . Glaucoma 10/01/2015  . GERD (gastroesophageal reflux disease)   . Hypertension   . Hyperlipidemia     Past Surgical History:  Procedure Laterality Date  . COLONOSCOPY    . NASAL SINUS SURGERY  1976    Prior to Admission medications   Medication Sig Start Date End Date Taking? Authorizing Provider  albuterol (PROVENTIL HFA;VENTOLIN HFA) 108 (90 BASE) MCG/ACT inhaler Inhale 2 puffs into the lungs every 4 (four) hours as needed for wheezing or shortness of breath. 10/01/15   Guadalupe Maple, MD  aspirin EC 81 MG tablet Take 81 mg by mouth daily.    Historical Provider, MD  bisoprolol-hydrochlorothiazide [facility])  2.5-6.25 MG tablet Take 2 tablets by mouth daily. 05/20/16   Guadalupe Maple, MD  ciprofloxacin (CIPRO) 250 MG tablet Take 1 tablet (250 mg total) by mouth 2 (two) times daily. 06/14/16   Guadalupe Maple, MD  fenofibrate 160 MG tablet Take 1 tablet (160 mg total) by mouth daily. 05/17/16   Guadalupe Maple, MD  latanoprost (XALATAN) 0.005 % ophthalmic solution  07/18/15   Historical Provider, MD  pravastatin (PRAVACHOL) 40 MG tablet Take 1 tablet (40 mg total) by mouth daily. 05/17/16   Guadalupe Maple, MD    Allergies  Allergen Reactions  . Sulfa Antibiotics   . Zithromax [Azithromycin]   . Penicillins Rash    No family history on file.  Social History Social History  Substance Use Topics  . Smoking status: Never Smoker  . Smokeless tobacco: Never Used  . Alcohol use No    Review of Systems Constitutional: Negative for fever. Cardiovascular: Negative for chest pain. Respiratory: Negative for shortness of breath. Gastrointestinal: Negative for abdominal pain Genitourinary: Negative for dysuria. Musculoskeletal: Right hand swelling, redness. Neurological: Negative for headache 10-point ROS otherwise negative.  ____________________________________________   PHYSICAL EXAM:  VITAL SIGNS: ED Triage Vitals [11/20/16 1845]  Enc Vitals Group     BP (!) 152/85     Pulse Rate 97     Resp 18     Temp 98.4 F (36.9 C)     Temp Source  Oral     SpO2 94 %     Weight 173 lb (78.5 kg)     Height 5\' 10"  (1.778 m)     Head Circumference      Peak Flow      Pain Score 7     Pain Loc      Pain Edu?      Excl. in [place]?     Constitutional: Alert and oriented. Well appearing and in no distress. Eyes: Normal exam ENT   Head: Normocephalic and atraumatic   Mouth/Throat: Mucous membranes are moist. Cardiovascular: Normal rate, regular rhythm. Respiratory: Normal respiratory effort without tachypnea nor retractions. Breath sounds are clear  Gastrointestinal: Soft and nontender. No  distention.   Musculoskeletal: Patient has moderate swelling to the dorsal aspect of the right hand. She does have small area on the fourth finger that appears to be a small little puncture wound versus insect bite. Patient has lymphatic streaking up the right arm. Neurovascularly intact. Is able to make a fist. Neurologic:  Normal speech and language. No gross focal neurologic deficits  Skin:  Skin is warm, dry and intact.  Psychiatric: Mood and affect are normal.   ____________________________________________   RADIOLOGY  Ultrasound shows no DVT.  ____________________________________________   INITIAL IMPRESSION / ASSESSMENT AND PLAN / ED COURSE  Pertinent labs & imaging results that were available during my care of the patient were reviewed by me and considered in my medical decision making (see chart for details).  The patient presents the emergency department with swelling and redness of the right hand, with lymphatic streaking. Patient has a small little puncture wound on the right fourth finger which could be an area of inoculation, for likely cellulitis. We'll obtain in all sounds rule out DVT. I do not suspect allergic reaction as it is localized to the right hand with lymphatic streaking. I discussed with the patient discontinuing the use of prednisone that was prescribed to her earlier today by the urgent care. We will dose IV antibiotics, obtain an ultrasound and closely monitor. Reassuringly the patient's lab shows a normal white blood cell count. Patient's blood glucose is elevated to 300 however she received an IM dose of steroids  this afternoon.  Stat negative for DVT. Patient had received vancomycin in the emergency department. We'll discharge with Keflex 3 times a day for 10 days. I discussed return precautions for worsening swelling/redness, fever or vomiting. Patient is agreeable.   __________________________________________   FINAL CLINICAL IMPRESSION(S) / ED  DIAGNOSES  Cellulitis, right upper extremity.    Harvest Dark, MD 11/20/16 [id number]    Harvest Dark, MD 11/20/16 [phone]

## 2016-11-20 NOTE — Discharge Instructions (Signed)
Please take your antibiotics as prescribed for the entire duration. Return to the emergency department for any fever, increased redness/swelling, vomiting unable to keep down your antibiotics, or any other symptom personally concerning to yourself.

## 2016-11-20 NOTE — ED Notes (Signed)
Patient transported to Ultrasound 

## 2017-01-04 ENCOUNTER — Telehealth: Payer: Self-pay | Admitting: Family Medicine

## 2017-01-04 ENCOUNTER — Other Ambulatory Visit: Payer: Self-pay | Admitting: Family Medicine

## 2017-01-04 MED ORDER — CIPROFLOXACIN HCL 250 MG PO TABS: 250.0000 mg | ORAL_TABLET | Freq: Two times a day (BID) | ORAL | 0 refills | Status: DC

## 2017-01-04 NOTE — Telephone Encounter (Signed)
Patient is hoping that Dr Jeananne Rama will be able to call her in an antibiotic for UTI.  She states she has had bouts of this in the past and has no transportation to the office.  She states she is burning and feeling the pressure to urinate but with little or no relief.    Please advise.   Thank You Santiago Glad

## 2017-01-04 NOTE — Telephone Encounter (Signed)
Routing to provider  

## 2017-01-04 NOTE — Telephone Encounter (Signed)
Rx sent to her pharmacy. If she's not better with that course of antibiotics, she will need to be seen.

## 2017-01-04 NOTE — Telephone Encounter (Signed)
Patient notified about medication and of what Dr. Durenda Age instructions were.

## 2017-02-16 DIAGNOSIS — H40003 Preglaucoma, unspecified, bilateral: Secondary | ICD-10-CM | POA: Diagnosis not present

## 2017-04-21 DIAGNOSIS — L570 Actinic keratosis: Secondary | ICD-10-CM | POA: Diagnosis not present

## 2017-04-21 DIAGNOSIS — B001 Herpesviral vesicular dermatitis: Secondary | ICD-10-CM | POA: Diagnosis not present

## 2017-05-06 ENCOUNTER — Encounter: Payer: Self-pay | Admitting: Family Medicine

## 2017-05-06 ENCOUNTER — Ambulatory Visit (INDEPENDENT_AMBULATORY_CARE_PROVIDER_SITE_OTHER): Payer: Medicare PPO | Admitting: Family Medicine

## 2017-05-06 VITALS — BP 169/84 | HR 70 | Temp 98.7°F | Wt 174.0 lb

## 2017-05-06 DIAGNOSIS — R52 Pain, unspecified: Secondary | ICD-10-CM

## 2017-05-06 DIAGNOSIS — N390 Urinary tract infection, site not specified: Secondary | ICD-10-CM | POA: Diagnosis not present

## 2017-05-06 DIAGNOSIS — R300 Dysuria: Secondary | ICD-10-CM | POA: Diagnosis not present

## 2017-05-06 MED ORDER — TIZANIDINE HCL 2 MG PO CAPS: 2.0000 mg | ORAL_CAPSULE | Freq: Three times a day (TID) | ORAL | 0 refills | Status: DC | PRN

## 2017-05-06 NOTE — Progress Notes (Signed)
   BP (!) 169/84   Pulse 70   Temp 98.7 F (37.1 C)   Wt 174 lb (78.9 kg)   SpO2 98%   BMI 24.97 kg/m    Subjective:    Patient ID: Brianna Curry, female    DOB: [birth year], 76 y.o.   MRN: [id number]  HPI: Brianna Curry is a 76 y.o. female  Chief Complaint  Patient presents with  . Generalized Body Aches    x approx 10 days. ache/hurts all on her right side only. symptoms started after taking prednisone ans Valtrex for fever blisters.   Patient presents with about 10 days of body aches on right side of body.States it feels somewhat similar to last year when she was treated for possible Lyme disease. Denies fever, chills, fatigue, new tick bites, recent travel, abnormal foods. Also with long hx of UTIs. Currently having mild dysuria but no other noticeable urinary sxs. Not trying anything OTC for sxs.   Relevant past medical, surgical, family and social history reviewed and updated as indicated. Interim medical history since our last visit reviewed. Allergies and medications reviewed and updated.  Review of Systems  Constitutional: Negative.   HENT: Negative.   Respiratory: Negative.   Cardiovascular: Negative.   Gastrointestinal: Negative.   Genitourinary: Positive for dysuria.  Musculoskeletal: Positive for arthralgias and myalgias.  Neurological: Negative.   Psychiatric/Behavioral: Negative.     Per HPI unless specifically indicated above     Objective:    BP (!) 169/84   Pulse 70   Temp 98.7 F (37.1 C)   Wt 174 lb (78.9 kg)   SpO2 98%   BMI 24.97 kg/m   Wt Readings from Last 3 Encounters:  05/06/17 174 lb (78.9 kg)  11/20/16 173 lb (78.5 kg)  06/14/16 176 lb (79.8 kg)    Physical Exam  Constitutional: She is oriented to person, place, and time. She appears well-developed and well-nourished. No distress.  HENT:  Head: Atraumatic.  Eyes: Conjunctivae are normal. No scleral icterus.  Neck: Normal range of motion. Neck supple.  Cardiovascular: Normal rate  and normal heart sounds.   Pulmonary/Chest: Effort normal. No respiratory distress.  Abdominal: Soft. Bowel sounds are normal. There is no tenderness.  Musculoskeletal: Normal range of motion.  Neurological: She is alert and oriented to person, place, and time.  Skin: Skin is warm and dry. No rash noted.  Psychiatric: She has a normal mood and affect. Her behavior is normal.  Nursing note and vitals reviewed.     Assessment & Plan:   Problem List Items Addressed This Visit    None    Visit Diagnoses    Acute lower UTI    -  Primary   U/A today with + leuks, will go ahead and treat with short course of cipro and await cx. This could be the cause of her malaise and aches.    Relevant Orders   UA/M w/rflx Culture, Routine (Completed)   Microscopic Examination (Completed)   Urine Culture, Routine (Completed)   Body aches       Will recheck tick labs today, continue OTC pain relievers, zanaflex at bedtime, and rest in the meantime. Strict precautions given with zanaflex    Relevant Orders   Lyme Ab/Western Blot Reflex   Rocky mtn spotted fvr abs pnl(IgG+IgM)       Follow up plan: Return in about 4 weeks (around 06/03/2017) for CPE.

## 2017-05-08 LAB — UA/M W/RFLX CULTURE, ROUTINE
Bilirubin, UA: NEGATIVE
Glucose, UA: NEGATIVE
Ketones, UA: NEGATIVE
Nitrite, UA: NEGATIVE
PH UA: 6 (ref 5.0–7.5)
PROTEIN UA: NEGATIVE
RBC, UA: NEGATIVE
Specific Gravity, UA: 1.015 (ref 1.005–1.030)
Urobilinogen, Ur: 0.2 mg/dL (ref 0.2–1.0)

## 2017-05-08 LAB — MICROSCOPIC EXAMINATION: BACTERIA UA: NONE SEEN

## 2017-05-08 LAB — URINE CULTURE, REFLEX

## 2017-05-09 ENCOUNTER — Other Ambulatory Visit: Payer: Self-pay | Admitting: Family Medicine

## 2017-05-09 DIAGNOSIS — E785 Hyperlipidemia, unspecified: Secondary | ICD-10-CM

## 2017-05-09 MED ORDER — NITROFURANTOIN MONOHYD MACRO 100 MG PO CAPS: 100.0000 mg | ORAL_CAPSULE | Freq: Two times a day (BID) | ORAL | 0 refills | Status: DC

## 2017-05-09 NOTE — Patient Instructions (Signed)
Follow up in 1 month for CPE 

## 2017-05-10 ENCOUNTER — Telehealth: Payer: Self-pay | Admitting: Family Medicine

## 2017-05-10 LAB — LYME AB/WESTERN BLOT REFLEX: Lyme IgG/IgM Ab: <0.91 {ISR} (ref 0.00–0.90)

## 2017-05-10 LAB — ROCKY MTN SPOTTED FVR ABS PNL(IGG+IGM)
RMSF IgG: NEGATIVE
RMSF IgM: 0.28 index (ref 0.00–0.89)

## 2017-05-10 NOTE — Telephone Encounter (Signed)
Routing to provider  

## 2017-05-10 NOTE — Telephone Encounter (Signed)
Patient called in regards to her recent blood work results. Patient wanted to know the results of her blood work from her last appointment.  Please Advise   Thank you

## 2017-05-10 NOTE — Telephone Encounter (Signed)
Routing to provider. Appt on 06/13/17 for a CPE with you.

## 2017-05-11 NOTE — Telephone Encounter (Signed)
Please let her know that her labs came back normal. Thanks! 

## 2017-05-11 NOTE — Telephone Encounter (Signed)
Called and left patient a VM letting her know labs were normal per Dr. Wynetta Emery.

## 2017-06-13 ENCOUNTER — Ambulatory Visit (INDEPENDENT_AMBULATORY_CARE_PROVIDER_SITE_OTHER): Payer: Medicare PPO | Admitting: Family Medicine

## 2017-06-13 ENCOUNTER — Encounter: Payer: Self-pay | Admitting: Family Medicine

## 2017-06-13 VITALS — BP 183/77 | HR 66 | Temp 98.0°F | Ht 69.75 in | Wt 177.0 lb

## 2017-06-13 DIAGNOSIS — I10 Essential (primary) hypertension: Secondary | ICD-10-CM

## 2017-06-13 DIAGNOSIS — E782 Mixed hyperlipidemia: Secondary | ICD-10-CM

## 2017-06-13 DIAGNOSIS — Z0000 Encounter for general adult medical examination without abnormal findings: Secondary | ICD-10-CM | POA: Diagnosis not present

## 2017-06-13 DIAGNOSIS — R748 Abnormal levels of other serum enzymes: Secondary | ICD-10-CM | POA: Diagnosis not present

## 2017-06-13 DIAGNOSIS — R0789 Other chest pain: Secondary | ICD-10-CM

## 2017-06-13 MED ORDER — ALBUTEROL SULFATE HFA 108 (90 BASE) MCG/ACT IN AERS: 2.0000 | INHALATION_SPRAY | RESPIRATORY_TRACT | 11 refills | Status: DC | PRN

## 2017-06-13 MED ORDER — BISOPROLOL-HYDROCHLOROTHIAZIDE 5-6.25 MG PO TABS: 1.0000 | ORAL_TABLET | Freq: Every day | ORAL | 1 refills | Status: DC

## 2017-06-13 NOTE — Progress Notes (Signed)
BP (!) 183/77 (BP Location: Right Arm)   Pulse 66   Temp 98 F (36.7 C)   Ht 5' 9.75" (1.772 m)   Wt 177 lb (80.3 kg)   SpO2 98%   BMI 25.58 kg/m    Subjective:    Patient ID: Brianna Curry, female    DOB: [birth year], 76 y.o.   MRN: [id number]  HPI: Brianna Curry is a 76 y.o. female presenting on 06/13/2017 for comprehensive medical examination. Current medical complaints include:see below  Having persistently elevated BP readings with ziac 2.5-6.25 BID. Taking faithfully without side effect, and denies CP, SOB, HAs. Wanting to know if she should double her dosage to two twice daily. Remains very active and eats a healthy, low salt diet.   Doing well on pravastatin, no cramping, abdominal pain, fatigue. Had mild elevation in LFTs last year so will recheck that today. Denies tylenol use or alcohol use.   Also notes some feelings of chest tightness going into right shoulder since being very ill with tick bourne illness last summer. Still very active with her garden and animals but doesn't know of any injury lately. No SOB, palpitations, weakness.   She currently lives with: alone Menopausal Symptoms: no  Functional Status Survey: Is the patient deaf or have difficulty hearing?: No Does the patient have difficulty seeing, even when wearing glasses/contacts?: No Does the patient have difficulty concentrating, remembering, or making decisions?: No Does the patient have difficulty walking or climbing stairs?: Yes (uses a cane occasionally) Does the patient have difficulty dressing or bathing?: No Does the patient have difficulty doing errands alone such as visiting a doctor's office or shopping?: No  Fall Risk  06/13/2017 05/17/2016  Falls in the past year? No No    Depression Screen Depression screen [facility] 2/9 06/13/2017 05/17/2016  Decreased Interest 0 0  Down, Depressed, Hopeless 0 0  PHQ - 2 Score 0 0    Advanced Directives Does patient have a HCPOA?    no If yes, name and  contact information:  Does patient have a living will or MOST form?  no  Past Medical History:  Past Medical History:  Diagnosis Date  . Anxiety   . GERD (gastroesophageal reflux disease)   . Glaucoma   . Hyperlipidemia   . Hypertension   . IBS (irritable bowel syndrome)     Surgical History:  Past Surgical History:  Procedure Laterality Date  . COLONOSCOPY    . NASAL SINUS SURGERY  1976    Medications:  Current Outpatient Prescriptions on File Prior to Visit  Medication Sig  . aspirin EC 81 MG tablet Take 81 mg by mouth daily.  . fenofibrate 160 MG tablet TAKE 1 TABLET EVERY DAY  . latanoprost (XALATAN) 0.005 % ophthalmic solution   . pravastatin (PRAVACHOL) 40 MG tablet TAKE 1 TABLET EVERY DAY   No current facility-administered medications on file prior to visit.     Allergies:  Allergies  Allergen Reactions  . Sulfa Antibiotics   . Zithromax [Azithromycin]   . Penicillins Rash    Social History:  Social History   Social History  . Marital status: Single    Spouse name: N/A  . Number of children: N/A  . Years of education: N/A   Occupational History  . Not on file.   Social History Main Topics  . Smoking status: Never Smoker  . Smokeless tobacco: Never Used  . Alcohol use No  . Drug use: No  . Sexual  activity: Not on file   Other Topics Concern  . Not on file   Social History Narrative  . No narrative on file   History  Smoking Status  . Never Smoker  Smokeless Tobacco  . Never Used   History  Alcohol Use No    Family History:  History reviewed. No pertinent family history.  Past medical history, surgical history, medications, allergies, family history and social history reviewed with patient today and changes made to appropriate areas of the chart.   Review of Systems - General ROS: negative Psychological ROS: negative Ophthalmic ROS: negative ENT ROS: negative Breast ROS: negative for breast lumps Respiratory ROS: no cough,  shortness of breath, or wheezing Cardiovascular ROS: positive for - chest pain and chest tightness Gastrointestinal ROS: no abdominal pain, change in bowel habits, or black or bloody stools Genito-Urinary ROS: no dysuria, trouble voiding, or hematuria Musculoskeletal ROS: negative Neurological ROS: no TIA or stroke symptoms Dermatological ROS: negative All other ROS negative except what is listed above and in the HPI.      Objective:    BP (!) 183/77 (BP Location: Right Arm)   Pulse 66   Temp 98 F (36.7 C)   Ht 5' 9.75" (1.772 m)   Wt 177 lb (80.3 kg)   SpO2 98%   BMI 25.58 kg/m   Wt Readings from Last 3 Encounters:  06/13/17 177 lb (80.3 kg)  05/06/17 174 lb (78.9 kg)  11/20/16 173 lb (78.5 kg)    Physical Exam  Constitutional: She is oriented to person, place, and time. She appears well-developed and well-nourished. No distress.  HENT:  Head: Atraumatic.  Right Ear: External ear normal.  Left Ear: External ear normal.  Nose: Nose normal.  Mouth/Throat: Oropharynx is clear and moist. No oropharyngeal exudate.  Eyes: Conjunctivae are normal. Pupils are equal, round, and reactive to light. No scleral icterus.  Neck: Normal range of motion. Neck supple. No thyromegaly present.  Cardiovascular: Normal rate, regular rhythm, normal heart sounds and intact distal pulses.   Pulmonary/Chest: Effort normal and breath sounds normal. No respiratory distress. Right breast exhibits no mass, no skin change and no tenderness. Left breast exhibits no mass, no skin change and no tenderness.  Abdominal: Soft. Bowel sounds are normal. She exhibits no mass. There is no tenderness.  Musculoskeletal: Normal range of motion. She exhibits no edema or tenderness.  Lymphadenopathy:    She has no cervical adenopathy.    She has no axillary adenopathy.  Neurological: She is alert and oriented to person, place, and time. No cranial nerve deficit.  Skin: Skin is warm and dry. No rash noted.    Psychiatric: She has a normal mood and affect. Her behavior is normal.  Nursing note and vitals reviewed.   Cognitive Testing - 6-CIT  Correct? Score   What year is it? yes 4 Yes = 0    No = 4  What month is it? yes 4 Yes = 0    No = 3  Remember:     Pia Mau, [name], [place]     What time is it? yes 4 Yes = 0    No = 3  Count backwards from 20 to 1 yes 4 Correct = 0    1 error = 2   More than 1 error = 4  Say the months of the year in reverse. yes 4 Correct = 0    1 error = 2   More than  1 error = 4  What address did I ask you to remember? yes 10 Correct = 0  1 error = 2    2 error = 4    3 error = 6    4 error = 8    All wrong = 10       TOTAL SCORE  30/28   Interpretation:  Normal  Normal (0-7) Abnormal (8-28)   Results for orders placed or performed in visit on 06/13/17  CBC with Differential/Platelet  Result Value Ref Range   WBC 4.2 3.4 - 10.8 x10E3/uL   RBC 4.90 3.77 - 5.28 x10E6/uL   Hemoglobin 14.3 11.1 - 15.9 g/dL   Hematocrit 42.4 34.0 - 46.6 %   MCV 87 79 - 97 fL   MCH 29.2 26.6 - 33.0 pg   MCHC 33.7 31.5 - 35.7 g/dL   RDW 13.8 12.3 - 15.4 %   Platelets 243 150 - 379 x10E3/uL   Neutrophils 40 Not Estab. %   Lymphs 45 Not Estab. %   Monocytes 9 Not Estab. %   Eos 5 Not Estab. %   Basos 1 Not Estab. %   Neutrophils Absolute 1.7 1.4 - 7.0 x10E3/uL   Lymphocytes Absolute 1.9 0.7 - 3.1 x10E3/uL   Monocytes Absolute 0.4 0.1 - 0.9 x10E3/uL   EOS (ABSOLUTE) 0.2 0.0 - 0.4 x10E3/uL   Basophils Absolute 0.0 0.0 - 0.2 x10E3/uL   Immature Granulocytes 0 Not Estab. %   Immature Grans (Abs) 0.0 0.0 - 0.1 x10E3/uL  Comprehensive metabolic panel  Result Value Ref Range   Glucose 120 (H) 65 - 99 mg/dL   BUN 13 8 - 27 mg/dL   Creatinine, Ser 0.93 0.57 - 1.00 mg/dL   GFR calc non Af Amer 59 (L) >59 mL/min/1.73   GFR calc Af Amer 69 >59 mL/min/1.73   BUN/Creatinine Ratio 14 12 - 28   Sodium 142 134 - 144 mmol/L   Potassium 4.3 3.5 - 5.2 mmol/L   Chloride 102 96  - 106 mmol/L   CO2 22 20 - 29 mmol/L   Calcium 9.8 8.7 - 10.3 mg/dL   Total Protein 7.5 6.0 - 8.5 g/dL   Albumin 4.8 3.5 - 4.8 g/dL   Globulin, Total 2.7 1.5 - 4.5 g/dL   Albumin/Globulin Ratio 1.8 1.2 - 2.2   Bilirubin Total 0.7 0.0 - 1.2 mg/dL   Alkaline Phosphatase 42 39 - 117 IU/L   AST 72 (H) 0 - 40 IU/L   ALT 46 (H) 0 - 32 IU/L  Lipid Panel w/o Chol/HDL Ratio  Result Value Ref Range   Cholesterol, Total 185 100 - 199 mg/dL   Triglycerides 145 0 - 149 mg/dL   HDL 38 (L) >39 mg/dL   VLDL Cholesterol Cal 29 5 - 40 mg/dL   LDL Calculated 118 (H) 0 - 99 mg/dL      Assessment & Plan:   Problem List Items Addressed This Visit      Cardiovascular and Mediastinum   Hypertension    Will increase dose of ziac. Continue to monitor routinely, alert office with persistent abnormals prior to scheduled f/u      Relevant Medications   bisoprolol-hydrochlorothiazide (ZIAC) 5-6.25 MG tablet   Other Relevant Orders   CBC with Differential/Platelet (Completed)     Other   Hyperlipidemia    Await fasting lipid results. Continue current regimen and good diet and exercise habits.       Relevant Medications   bisoprolol-hydrochlorothiazide [facility])  5-6.25 MG tablet   Other Relevant Orders   Comprehensive metabolic panel (Completed)   Lipid Panel w/o Chol/HDL Ratio (Completed)   Elevated liver enzymes    Will recheck LFTs today, if still elevated will back off on the statin and recheck at 6 month f/u. Continued avoidance of liver irritants such as alcohol and tylenol      Relevant Orders   Comprehensive metabolic panel (Completed)    Other Visit Diagnoses    Encounter for Medicare annual wellness exam    -  Primary   Chest tightness       EKG WNL, suspect msk origin of discomfort. Stretches, epsom soaks, massage, topical pain relievers prn. Return precautions given for changing or worsening sxs   Relevant Orders   EKG 12-Lead (Completed)       Preventative Services:  AAA  screening:  Health Risk Assessment and Personalized Prevention Plan: Bone Mass Measurements: Breast Cancer Screening: CVD Screening:  Cervical Cancer Screening: Colon Cancer Screening:  Depression Screening:  Diabetes Screening:  Glaucoma Screening:  Hepatitis B vaccine: Hepatitis C screening:  HIV Screening: Flu Vaccine: Lung cancer Screening: Obesity Screening:  Pneumonia Vaccines (2): STI Screening:  Follow up plan: Return in about 6 months (around 12/13/2017) for BP, Cholesterol .   LABORATORY TESTING:  - Pap smear: not applicable  IMMUNIZATIONS:   - Tdap: Tetanus vaccination status reviewed: last tetanus booster within 10 years. - Influenza: Postponed to flu season - Pneumovax: Up to date - Prevnar: Up to date - Zostavax vaccine: Up to date  SCREENING: -Mammogram: Not applicable  - Colonoscopy: Up to date  - Bone Density: Up to date   PATIENT COUNSELING:   Advised to take 1 mg of folate supplement per day if capable of pregnancy.   Sexuality: Discussed sexually transmitted diseases, partner selection, use of condoms, avoidance of unintended pregnancy  and contraceptive alternatives.   Advised to avoid cigarette smoking.  I discussed with the patient that most people either abstain from alcohol or drink within safe limits (<=14/week and <=4 drinks/occasion for males, <=7/weeks and <= 3 drinks/occasion for females) and that the risk for alcohol disorders and other health effects rises proportionally with the number of drinks per week and how often a drinker exceeds daily limits.  Discussed cessation/primary prevention of drug use and availability of treatment for abuse.   Diet: Encouraged to adjust caloric intake to maintain  or achieve ideal body weight, to reduce intake of dietary saturated fat and total fat, to limit sodium intake by avoiding high sodium foods and not adding table salt, and to maintain adequate dietary potassium and calcium preferably from fresh  fruits, vegetables, and low-fat dairy products.    stressed the importance of regular exercise  Injury prevention: Discussed safety belts, safety helmets, smoke detector, smoking near bedding or upholstery.   Dental health: Discussed importance of regular tooth brushing, flossing, and dental visits.    NEXT PREVENTATIVE PHYSICAL DUE IN 1 YEAR. Return in about 6 months (around 12/13/2017) for BP, Cholesterol .

## 2017-06-14 LAB — CBC WITH DIFFERENTIAL/PLATELET
Basophils Absolute: 0 10*3/uL (ref 0.0–0.2)
Basos: 1 %
EOS (ABSOLUTE): 0.2 10*3/uL (ref 0.0–0.4)
EOS: 5 %
HEMATOCRIT: 42.4 % (ref 34.0–46.6)
Hemoglobin: 14.3 g/dL (ref 11.1–15.9)
IMMATURE GRANS (ABS): 0 10*3/uL (ref 0.0–0.1)
IMMATURE GRANULOCYTES: 0 %
LYMPHS ABS: 1.9 10*3/uL (ref 0.7–3.1)
Lymphs: 45 %
MCH: 29.2 pg (ref 26.6–33.0)
MCHC: 33.7 g/dL (ref 31.5–35.7)
MCV: 87 fL (ref 79–97)
Monocytes Absolute: 0.4 10*3/uL (ref 0.1–0.9)
Monocytes: 9 %
NEUTROS PCT: 40 %
Neutrophils Absolute: 1.7 10*3/uL (ref 1.4–7.0)
Platelets: 243 10*3/uL (ref 150–379)
RBC: 4.9 x10E6/uL (ref 3.77–5.28)
RDW: 13.8 % (ref 12.3–15.4)
WBC: 4.2 10*3/uL (ref 3.4–10.8)

## 2017-06-14 LAB — COMPREHENSIVE METABOLIC PANEL
ALBUMIN: 4.8 g/dL (ref 3.5–4.8)
ALT: 46 IU/L — ABNORMAL HIGH (ref 0–32)
AST: 72 IU/L — ABNORMAL HIGH (ref 0–40)
Albumin/Globulin Ratio: 1.8 (ref 1.2–2.2)
Alkaline Phosphatase: 42 IU/L (ref 39–117)
BUN / CREAT RATIO: 14 (ref 12–28)
BUN: 13 mg/dL (ref 8–27)
Bilirubin Total: 0.7 mg/dL (ref 0.0–1.2)
CALCIUM: 9.8 mg/dL (ref 8.7–10.3)
CO2: 22 mmol/L (ref 20–29)
CREATININE: 0.93 mg/dL (ref 0.57–1.00)
Chloride: 102 mmol/L (ref 96–106)
GFR calc Af Amer: 69 mL/min/{1.73_m2} (ref >59)
GFR, EST NON AFRICAN AMERICAN: 59 mL/min/{1.73_m2} — AB (ref >59)
GLOBULIN, TOTAL: 2.7 g/dL (ref 1.5–4.5)
GLUCOSE: 120 mg/dL — AB (ref 65–99)
Potassium: 4.3 mmol/L (ref 3.5–5.2)
SODIUM: 142 mmol/L (ref 134–144)
Total Protein: 7.5 g/dL (ref 6.0–8.5)

## 2017-06-14 LAB — LIPID PANEL W/O CHOL/HDL RATIO
Cholesterol, Total: 185 mg/dL (ref 100–199)
HDL: 38 mg/dL — ABNORMAL LOW (ref >39)
LDL CALC: 118 mg/dL — AB (ref 0–99)
Triglycerides: 145 mg/dL (ref 0–149)
VLDL CHOLESTEROL CAL: 29 mg/dL (ref 5–40)

## 2017-06-14 NOTE — Assessment & Plan Note (Signed)
Await fasting lipid results. Continue current regimen and good diet and exercise habits.

## 2017-06-14 NOTE — Assessment & Plan Note (Signed)
Will increase dose of ziac. Continue to monitor routinely, alert office with persistent abnormals prior to scheduled f/u

## 2017-06-14 NOTE — Assessment & Plan Note (Signed)
Will recheck LFTs today, if still elevated will back off on the statin and recheck at 6 month f/u. Continued avoidance of liver irritants such as alcohol and tylenol

## 2017-06-15 ENCOUNTER — Other Ambulatory Visit: Payer: Self-pay | Admitting: Family Medicine

## 2017-06-15 NOTE — Telephone Encounter (Signed)
Her liver enzymes are still elevated, and the pravastatin doesn't seem to be budging her cholesterol level much. Let's take a holiday from the statin and recheck her fasting in 3 months. Thanks

## 2017-06-15 NOTE — Telephone Encounter (Signed)
Attempted to call patient, phone line was busy.

## 2017-06-17 MED ORDER — BISOPROLOL-HYDROCHLOROTHIAZIDE 5-6.25 MG PO TABS: 1.0000 | ORAL_TABLET | Freq: Two times a day (BID) | ORAL | 1 refills | Status: DC

## 2017-06-17 NOTE — Telephone Encounter (Signed)
PT is taking 5 mg BID, does need new RX as RX is written for once daily. Pt states she is no longer having her headaches, but she has not actually checked BP.

## 2017-06-17 NOTE — Telephone Encounter (Signed)
Patient returned my call. I let her know what Apolonio Schneiders said about her labs and statin medication. Patient verbalized understanding. Patient also asked on the phone about her BP medication, bisoprolol/hctz. Patient states that she thought that she was supposed to start taking 10 mg daily but the prescription is for the 5 mg. Patient states that she started taking 2 tablets of the medication on Monday to equal the 10 mg and states that she has been feeling better since doing this. Patient wants to know if she could have the prescription changed and resent to [facility].

## 2017-06-17 NOTE — Telephone Encounter (Signed)
She was to be taking 5 mg twice daily on the ziac. If her BPs are still elevated on that, we can discuss increasing her medication. She was previously on 2.5 BID.

## 2017-06-17 NOTE — Telephone Encounter (Signed)
Called and left patient a VM asking for her to please return our call.  

## 2017-07-13 ENCOUNTER — Other Ambulatory Visit: Payer: Self-pay | Admitting: Family Medicine

## 2017-07-13 DIAGNOSIS — E785 Hyperlipidemia, unspecified: Secondary | ICD-10-CM

## 2017-07-26 ENCOUNTER — Emergency Department
Admission: EM | Admit: 2017-07-26 | Discharge: 2017-07-26 | Disposition: A | Discharge status: Home / Self Care | Payer: Medicare PPO | Attending: Emergency Medicine | Admitting: Emergency Medicine

## 2017-07-26 ENCOUNTER — Encounter: Payer: Self-pay | Admitting: Emergency Medicine

## 2017-07-26 DIAGNOSIS — Z79899 Other long term (current) drug therapy: Secondary | ICD-10-CM | POA: Diagnosis not present

## 2017-07-26 DIAGNOSIS — Z7902 Long term (current) use of antithrombotics/antiplatelets: Secondary | ICD-10-CM | POA: Insufficient documentation

## 2017-07-26 DIAGNOSIS — H6123 Impacted cerumen, bilateral: Secondary | ICD-10-CM | POA: Insufficient documentation

## 2017-07-26 DIAGNOSIS — Z7982 Long term (current) use of aspirin: Secondary | ICD-10-CM | POA: Insufficient documentation

## 2017-07-26 DIAGNOSIS — I10 Essential (primary) hypertension: Secondary | ICD-10-CM | POA: Diagnosis not present

## 2017-07-26 DIAGNOSIS — H9203 Otalgia, bilateral: Secondary | ICD-10-CM | POA: Diagnosis present

## 2017-07-26 NOTE — ED Notes (Signed)
Irrigated with approx 20 mls of warm water   No change

## 2017-07-26 NOTE — Discharge Instructions (Signed)
Continue earwax softener for 2 more days. Also advised over-the-counter Sudafed and 30 mg. Return in 2 days if no improvement.

## 2017-07-26 NOTE — ED Triage Notes (Signed)
Patient complaining of "fullness" in both ears, worse on right.  States this started on Saturday and is gradually getting worse.  Patient states she feels her pulse in her ear.  History of wax buildup in her ears.

## 2017-07-26 NOTE — ED Notes (Signed)
See triage note  States she developed some fullness to both ears  Was seen by PCP's office  They told her she had wax build up but did not irrigate ears  Unable to see [facility] until the middle of the month.  Right is worse than left

## 2017-07-26 NOTE — ED Provider Notes (Signed)
[facility] Emergency Department Provider Note   ____________________________________________   First MD Initiated Contact with Patient 07/26/17 1003     (approximate)  I have reviewed the triage vital signs and the nursing notes.   HISTORY  Chief Complaint Otalgia    HPI Brianna Curry is a 76 y.o. female patient complaining of fourth years however the right is worse than the left. She also complaining of decreased hearing loss. Patient states she felt this pressure on her eardrum. Patient has a history of impaction. Patient denies URI signs and symptoms. Patient denies vertigo. No palliative measures for complaint.Patient rates pain as a 10 over 10. Patient describes her pain as "pressure".   Past Medical History:  Diagnosis Date  . Anxiety   . GERD (gastroesophageal reflux disease)   . Glaucoma   . Hyperlipidemia   . Hypertension   . IBS (irritable bowel syndrome)     Patient Active Problem List   Diagnosis Date Noted  . Elevated liver enzymes 10/01/2015  . Glaucoma 10/01/2015  . GERD (gastroesophageal reflux disease)   . Hypertension   . Hyperlipidemia     Past Surgical History:  Procedure Laterality Date  . COLONOSCOPY    . NASAL SINUS SURGERY  1976    Prior to Admission medications   Medication Sig Start Date End Date Taking? Authorizing Provider  albuterol (PROVENTIL HFA;VENTOLIN HFA) 108 (90 Base) MCG/ACT inhaler Inhale 2 puffs into the lungs every 4 (four) hours as needed for wheezing or shortness of breath. 06/13/17   Volney American, PA-C  aspirin EC 81 MG tablet Take 81 mg by mouth daily.    [provider]  bisoprolol-hydrochlorothiazide (ZIAC) 5-6.25 MG tablet Take 1 tablet by mouth 2 (two) times daily. 06/17/17   Volney American, PA-C  Docusate Calcium (STOOL SOFTENER PO) Take by mouth daily.    [provider]  fenofibrate 160 MG tablet TAKE 1 TABLET EVERY DAY 07/14/17   Johnson, Megan P, DO    FIBER PO Take by mouth daily.    [provider]  latanoprost (XALATAN) 0.005 % ophthalmic solution  07/18/15   [provider]  Multiple Vitamin (MULTIVITAMIN) tablet Take 1 tablet by mouth daily.    [provider]  pravastatin (PRAVACHOL) 40 MG tablet TAKE 1 TABLET EVERY DAY 07/14/17   Park Liter P, DO    Allergies Sulfa antibiotics; Zithromax [azithromycin]; and Penicillins  No family history on file.  Social History Social History  Substance Use Topics  . Smoking status: Never Smoker  . Smokeless tobacco: Never Used  . Alcohol use No    Review of Systems  Constitutional: No fever/chills Eyes: No visual changes. ENT: No sore throat. Cardiovascular: Denies chest pain. Respiratory: Denies shortness of breath. Gastrointestinal: No abdominal pain.  No nausea, no vomiting.  No diarrhea.  No constipation. Genitourinary: Negative for dysuria. Musculoskeletal: Negative for back pain. Skin: Negative for rash. Neurological: Negative for headaches, focal weakness or numbness. Psychiatric:Anxiety Endocrine:Hyperlipidemia and hypertension Hematological/Lymphatic: Allergic/Immunilogical: See medication list  ____________________________________________   PHYSICAL EXAM:  VITAL SIGNS: ED Triage Vitals  Enc Vitals Group     BP 07/26/17 0939 (!) 153/73     Pulse Rate 07/26/17 0939 76     Resp 07/26/17 0939 16     Temp 07/26/17 0939 98.6 F (37 C)     Temp src --      SpO2 07/26/17 0939 95 %     Weight 07/26/17 0940 174 lb (  78.9 kg)     Height 07/26/17 0940 5\' 10"  (1.778 m)     Head Circumference --      Peak Flow --      Pain Score 07/26/17 0938 10     Pain Loc --      Pain Edu? --      Excl. in [place]? --    Constitutional: Alert and oriented. Well appearing and in no acute distress. EARS: TM not visible secondary to impaction Mouth/Throat: Mucous membranes are moist.  Oropharynx non-erythematous. Neck: No stridor.  No cervical spine  tenderness to palpation. Cardiovascular: Normal rate, regular rhythm. Grossly normal heart sounds.  Good peripheral circulation. Respiratory: Normal respiratory effort.  No retractions. Lungs CTAB. Neurologic:  Normal speech and language. No gross focal neurologic deficits are appreciated. No gait instability. Skin:  Skin is warm, dry and intact. No rash noted. Psychiatric: Mood and affect are normal. Speech and behavior are normal.  ____________________________________________   LABS (all labs ordered are listed, but only abnormal results are displayed)  Labs Reviewed - No data to display ____________________________________________  EKG   ____________________________________________  RADIOLOGY  No results found.  ____________________________________________   PROCEDURES  Procedure(s) performed: None  Procedures  Critical Care performed: No  ____________________________________________   INITIAL IMPRESSION / ASSESSMENT AND PLAN / ED COURSE  Pertinent labs & imaging results that were available during my care of the patient were reviewed by me and considered in my medical decision making (see chart for details).  Bilateral cerumen impaction. Was able to remove 2 large plugs from right ear with irrigation. Patient continues complaining of decreased hearing of the right ear. Left ear was completely irrigated removing impaction. Patient advised to continue using over-the-counter ear wax softener and a mild decongestant. Advised return back in 2 days to the ED or PCP for reevaluation.      ____________________________________________   FINAL CLINICAL IMPRESSION(S) / ED DIAGNOSES  Final diagnoses:  Bilateral impacted cerumen      NEW MEDICATIONS STARTED DURING THIS VISIT:  New Prescriptions   No medications on file     Note:  This document was prepared using Dragon voice recognition software and may include unintentional dictation errors.    Sable Feil, PA-C 07/26/17 1115    Schaevitz, Randall An, MD 07/26/17 [phone]

## 2017-08-03 DIAGNOSIS — H6123 Impacted cerumen, bilateral: Secondary | ICD-10-CM | POA: Diagnosis not present

## 2017-08-03 DIAGNOSIS — H903 Sensorineural hearing loss, bilateral: Secondary | ICD-10-CM | POA: Diagnosis not present

## 2017-09-05 ENCOUNTER — Encounter: Payer: Self-pay | Admitting: Family Medicine

## 2017-09-05 ENCOUNTER — Ambulatory Visit (INDEPENDENT_AMBULATORY_CARE_PROVIDER_SITE_OTHER): Payer: Medicare PPO | Admitting: Family Medicine

## 2017-09-05 DIAGNOSIS — I10 Essential (primary) hypertension: Secondary | ICD-10-CM | POA: Diagnosis not present

## 2017-09-05 DIAGNOSIS — E785 Hyperlipidemia, unspecified: Secondary | ICD-10-CM | POA: Diagnosis not present

## 2017-09-05 DIAGNOSIS — R238 Other skin changes: Secondary | ICD-10-CM

## 2017-09-05 MED ORDER — MUPIROCIN 2 % EX OINT: 1.0000 "application " | TOPICAL_OINTMENT | Freq: Two times a day (BID) | CUTANEOUS | 0 refills | Status: DC

## 2017-09-05 MED ORDER — FENOFIBRATE 160 MG PO TABS: 160.0000 mg | ORAL_TABLET | Freq: Every day | ORAL | 2 refills | Status: DC

## 2017-09-05 NOTE — Assessment & Plan Note (Signed)
Bulla I&D performed will give Bactroban ointment to apply if not improving will need to further evaluate may need oral antibiotics.

## 2017-09-05 NOTE — Assessment & Plan Note (Signed)
Blood pressure elevated today but patient under stress will just observe blood pressure not make any medication change.

## 2017-09-05 NOTE — Progress Notes (Signed)
BP (!) 146/80   Pulse 69   Wt 174 lb (78.9 kg)   SpO2 98%   BMI 24.97 kg/m    Subjective:    Patient ID: Brianna Curry, female    DOB: [birth year], 76 y.o.   MRN: [id number]  HPI: Brianna Curry is a 76 y.o. female  Chief Complaint  Patient presents with  . Foot Injury    Right foot. Last time pt was treate w/ keflex 300 tid  Patient with unknown injury to her right foot Now with marked illness formation no real itching but redness starting develop secondary infection. Bulla was opened and drained after alcohol prep and informed consent. Wound was dressed. Old chart was reviewed both the emergency room and dermatology patient is intolerant of prednisone took IV antibiotics last time but there is no secondary sign of infection at this time.  Relevant past medical, surgical, family and social history reviewed and updated as indicated. Interim medical history since our last visit reviewed. Allergies and medications reviewed and updated.  Review of Systems  Constitutional: Negative.   Respiratory: Negative.   Cardiovascular: Negative.     Per HPI unless specifically indicated above     Objective:    BP (!) 146/80   Pulse 69   Wt 174 lb (78.9 kg)   SpO2 98%   BMI 24.97 kg/m   Wt Readings from Last 3 Encounters:  09/05/17 174 lb (78.9 kg)  07/26/17 174 lb (78.9 kg)  06/13/17 177 lb (80.3 kg)    Physical Exam  Constitutional: She is oriented to person, place, and time. She appears well-developed and well-nourished.  HENT:  Head: Normocephalic and atraumatic.  Eyes: Conjunctivae and EOM are normal.  Neck: Normal range of motion.  Cardiovascular: Normal rate, regular rhythm and normal heart sounds.   Pulmonary/Chest: Effort normal and breath sounds normal.  Musculoskeletal: Normal range of motion.  Neurological: She is alert and oriented to person, place, and time.  Skin: No erythema.  Bullous formation right anterior foot incision and drainage made as noted above    Psychiatric: She has a normal mood and affect. Her behavior is normal. Judgment and thought content normal.    Results for orders placed or performed in visit on 06/13/17  CBC with Differential/Platelet  Result Value Ref Range   WBC 4.2 3.4 - 10.8 x10E3/uL   RBC 4.90 3.77 - 5.28 x10E6/uL   Hemoglobin 14.3 11.1 - 15.9 g/dL   Hematocrit 42.4 34.0 - 46.6 %   MCV 87 79 - 97 fL   MCH 29.2 26.6 - 33.0 pg   MCHC 33.7 31.5 - 35.7 g/dL   RDW 13.8 12.3 - 15.4 %   Platelets 243 150 - 379 x10E3/uL   Neutrophils 40 Not Estab. %   Lymphs 45 Not Estab. %   Monocytes 9 Not Estab. %   Eos 5 Not Estab. %   Basos 1 Not Estab. %   Neutrophils Absolute 1.7 1.4 - 7.0 x10E3/uL   Lymphocytes Absolute 1.9 0.7 - 3.1 x10E3/uL   Monocytes Absolute 0.4 0.1 - 0.9 x10E3/uL   EOS (ABSOLUTE) 0.2 0.0 - 0.4 x10E3/uL   Basophils Absolute 0.0 0.0 - 0.2 x10E3/uL   Immature Granulocytes 0 Not Estab. %   Immature Grans (Abs) 0.0 0.0 - 0.1 x10E3/uL  Comprehensive metabolic panel  Result Value Ref Range   Glucose 120 (H) 65 - 99 mg/dL   BUN 13 8 - 27 mg/dL   Creatinine, Ser 0.93 0.57 -  1.00 mg/dL   GFR calc non Af Amer 59 (L) >59 mL/min/1.73   GFR calc Af Amer 69 >59 mL/min/1.73   BUN/Creatinine Ratio 14 12 - 28   Sodium 142 134 - 144 mmol/L   Potassium 4.3 3.5 - 5.2 mmol/L   Chloride 102 96 - 106 mmol/L   CO2 22 20 - 29 mmol/L   Calcium 9.8 8.7 - 10.3 mg/dL   Total Protein 7.5 6.0 - 8.5 g/dL   Albumin 4.8 3.5 - 4.8 g/dL   Globulin, Total 2.7 1.5 - 4.5 g/dL   Albumin/Globulin Ratio 1.8 1.2 - 2.2   Bilirubin Total 0.7 0.0 - 1.2 mg/dL   Alkaline Phosphatase 42 39 - 117 IU/L   AST 72 (H) 0 - 40 IU/L   ALT 46 (H) 0 - 32 IU/L  Lipid Panel w/o Chol/HDL Ratio  Result Value Ref Range   Cholesterol, Total 185 100 - 199 mg/dL   Triglycerides 145 0 - 149 mg/dL   HDL 38 (L) >39 mg/dL   VLDL Cholesterol Cal 29 5 - 40 mg/dL   LDL Calculated 118 (H) 0 - 99 mg/dL      Assessment & Plan:   Problem List Items  Addressed This Visit      Cardiovascular and Mediastinum   Hypertension    Blood pressure elevated today but patient under stress will just observe blood pressure not make any medication change.      Relevant Medications   fenofibrate 160 MG tablet     Other   Hyperlipidemia   Relevant Medications   fenofibrate 160 MG tablet   Skin bulla    Bulla I&D performed will give Bactroban ointment to apply if not improving will need to further evaluate may need oral antibiotics.          Follow up plan: Return if symptoms worsen or fail to improve, for As scheduled.

## 2017-09-13 ENCOUNTER — Telehealth: Payer: Self-pay | Admitting: Family Medicine

## 2017-09-13 NOTE — Telephone Encounter (Signed)
Needs appointment

## 2017-09-13 NOTE — Telephone Encounter (Signed)
Routing to provider  

## 2017-09-13 NOTE — Telephone Encounter (Signed)
Patient scheduled for 9/19 @ 10:45 with Brianna Curry

## 2017-09-13 NOTE — Telephone Encounter (Signed)
Patient was seen last Monday by Dr Jeananne Rama for some type of bite on her foot. Dr Jeananne Rama gave her a cream and she also has been soaking it in epsom salt water but she is continuing to have the issue.  She was supposed to call back in a week to let Dr Jeananne Rama know if she was any better.  She continues to have the symptoms and feels she may have some type of infection in her blood because she is tired with this also.  She still has some of the cream left but thinks she may need an antibiotic to help also.    Please advise.  Thank you  [phone]

## 2017-09-13 NOTE — Telephone Encounter (Signed)
Santiago Glad please get patient scheduled.

## 2017-09-14 ENCOUNTER — Ambulatory Visit: Payer: Medicare PPO | Admitting: Family Medicine

## 2017-09-23 ENCOUNTER — Encounter: Payer: Self-pay | Admitting: Family Medicine

## 2017-09-23 ENCOUNTER — Ambulatory Visit (INDEPENDENT_AMBULATORY_CARE_PROVIDER_SITE_OTHER): Payer: Medicare PPO | Admitting: Family Medicine

## 2017-09-23 VITALS — BP 179/81 | HR 73 | Wt 170.0 lb

## 2017-09-23 DIAGNOSIS — K625 Hemorrhage of anus and rectum: Secondary | ICD-10-CM

## 2017-09-23 DIAGNOSIS — R748 Abnormal levels of other serum enzymes: Secondary | ICD-10-CM | POA: Diagnosis not present

## 2017-09-23 LAB — CBC WITH DIFFERENTIAL/PLATELET
HEMOGLOBIN: 16.2 g/dL — AB (ref 11.1–15.9)
Hematocrit: 49 % — ABNORMAL HIGH (ref 34.0–46.6)
LYMPHS ABS: 1.4 10*3/uL (ref 0.7–3.1)
Lymphs: 33 %
MCH: 29.4 pg (ref 26.6–33.0)
MCHC: 33.1 g/dL (ref 31.5–35.7)
MCV: 89 fL (ref 79–97)
MID (Absolute): 0.3 10*3/uL (ref 0.1–1.6)
MID: 7 %
NEUTROS ABS: 2.4 10*3/uL (ref 1.4–7.0)
NEUTROS PCT: 61 %
Platelets: 258 10*3/uL (ref 150–379)
RBC: 5.51 x10E6/uL — ABNORMAL HIGH (ref 3.77–5.28)
RDW: 14.2 % (ref 12.3–15.4)
WBC: 4.1 10*3/uL (ref 3.4–10.8)

## 2017-09-23 MED ORDER — HYDROCORTISONE 2.5 % RE CREA: 1.0000 "application " | TOPICAL_CREAM | Freq: Two times a day (BID) | RECTAL | 3 refills | Status: DC

## 2017-09-23 NOTE — Progress Notes (Signed)
BP (!) 179/81   Pulse 73   Wt 170 lb (77.1 kg)   SpO2 97%   BMI 24.39 kg/m    Subjective:    Patient ID: Brianna Curry, female    DOB: May 10, 1941, 76 y.o.   MRN: [id number]  HPI: Brianna Curry is a 76 y.o. female  Chief Complaint  Patient presents with  . Rectal Bleeding    Started last night. Continous. Kotex x 3 times.    Patient presents with bright red rectal bleeding since yesterday evening and painful BMs. Stools have been regular and soft the past week, but does have a long hx of issues with constipation. No known hx previously of hemorrhoids, fissures, or other anorectal issues. Bleeding has slowed down this morning, but states overnight she went through 3 pads. Prep H helping some. Denies fever, chills, abdominal pain, weakness, CP, SOB.   Relevant past medical, surgical, family and social history reviewed and updated as indicated. Interim medical history since our last visit reviewed. Allergies and medications reviewed and updated.  Review of Systems  Constitutional: Negative.   Respiratory: Negative.   Cardiovascular: Negative.   Gastrointestinal: Positive for blood in stool and rectal pain.  Genitourinary: Negative.  Negative for hematuria.  Musculoskeletal: Negative.   Neurological: Negative.   Psychiatric/Behavioral: Negative.    Per HPI unless specifically indicated above     Objective:    BP (!) 179/81   Pulse 73   Wt 170 lb (77.1 kg)   SpO2 97%   BMI 24.39 kg/m   Wt Readings from Last 3 Encounters:  09/23/17 170 lb (77.1 kg)  09/05/17 174 lb (78.9 kg)  07/26/17 174 lb (78.9 kg)    Physical Exam  Constitutional: She is oriented to person, place, and time. She appears well-developed and well-nourished. No distress.  HENT:  Head: Atraumatic.  Eyes: Conjunctivae are normal. No scleral icterus.  Neck: Normal range of motion. Neck supple.  Cardiovascular: Normal rate and normal heart sounds.   Pulmonary/Chest: Effort normal and breath sounds  normal. No respiratory distress.  Abdominal: Soft. Bowel sounds are normal. There is no tenderness. There is no guarding.  Genitourinary:     Genitourinary Comments: Anal area too tender and inflamed for internal exam today  Musculoskeletal: Normal range of motion.  Neurological: She is alert and oriented to person, place, and time.  Skin: Skin is warm and dry. No erythema. No pallor.  Psychiatric: She has a normal mood and affect. Her behavior is normal.  Nursing note and vitals reviewed.   Results for orders placed or performed in visit on 09/23/17  CBC With Differential/Platelet  Result Value Ref Range   WBC 4.1 3.4 - 10.8 x10E3/uL   RBC 5.51 (H) 3.77 - 5.28 x10E6/uL   Hemoglobin 16.2 (H) 11.1 - 15.9 g/dL   Hematocrit 49.0 (H) 34.0 - 46.6 %   MCV 89 79 - 97 fL   MCH 29.4 26.6 - 33.0 pg   MCHC 33.1 31.5 - 35.7 g/dL   RDW 14.2 12.3 - 15.4 %   Platelets 258 150 - 379 x10E3/uL   Neutrophils 61 Not Estab. %   Lymphs 33 Not Estab. %   MID 7 Not Estab. %   Neutrophils Absolute 2.4 1.4 - 7.0 x10E3/uL   Lymphocytes Absolute 1.4 0.7 - 3.1 x10E3/uL   MID (Absolute) 0.3 0.1 - 1.6 X10E3/uL  Comprehensive metabolic panel  Result Value Ref Range   Glucose 136 (H) 65 - 99 mg/dL  BUN 12 8 - 27 mg/dL   Creatinine, Ser 0.86 0.57 - 1.00 mg/dL   GFR calc non Af Amer 65 >59 mL/min/1.73   GFR calc Af Amer 75 >59 mL/min/1.73   BUN/Creatinine Ratio 14 12 - 28   Sodium 141 134 - 144 mmol/L   Potassium 4.4 3.5 - 5.2 mmol/L   Chloride 101 96 - 106 mmol/L   CO2 21 20 - 29 mmol/L   Calcium 10.0 8.7 - 10.3 mg/dL   Total Protein 7.9 6.0 - 8.5 g/dL   Albumin 4.9 (H) 3.5 - 4.8 g/dL   Globulin, Total 3.0 1.5 - 4.5 g/dL   Albumin/Globulin Ratio 1.6 1.2 - 2.2   Bilirubin Total 0.7 0.0 - 1.2 mg/dL   Alkaline Phosphatase 49 39 - 117 IU/L   AST 97 (H) 0 - 40 IU/L   ALT 63 (H) 0 - 32 IU/L      Assessment & Plan:   Problem List Items Addressed This Visit      Other   Elevated liver enzymes     Has been off statin for several months, will recheck enzymes and adjust as needed      Relevant Orders   Comprehensive metabolic panel (Completed)    Other Visit Diagnoses    Rectal bleeding    -  Primary   From hemorrhoid flare. CBC WNL. Treat with anusol, sitz baths, tucks pads, bland diet, fiber supplements, miralax daily to keep stools soft.   Relevant Orders   CBC With Differential/Platelet (Completed)       Follow up plan: Return for as scheduled.

## 2017-09-23 NOTE — Patient Instructions (Signed)
Sitz baths Tucks pads Ice packs

## 2017-09-24 LAB — COMPREHENSIVE METABOLIC PANEL
ALBUMIN: 4.9 g/dL — AB (ref 3.5–4.8)
ALK PHOS: 49 IU/L (ref 39–117)
ALT: 63 IU/L — ABNORMAL HIGH (ref 0–32)
AST: 97 IU/L — AB (ref 0–40)
Albumin/Globulin Ratio: 1.6 (ref 1.2–2.2)
BILIRUBIN TOTAL: 0.7 mg/dL (ref 0.0–1.2)
BUN / CREAT RATIO: 14 (ref 12–28)
BUN: 12 mg/dL (ref 8–27)
CHLORIDE: 101 mmol/L (ref 96–106)
CO2: 21 mmol/L (ref 20–29)
CREATININE: 0.86 mg/dL (ref 0.57–1.00)
Calcium: 10 mg/dL (ref 8.7–10.3)
GFR calc Af Amer: 75 mL/min/{1.73_m2} (ref >59)
GFR calc non Af Amer: 65 mL/min/{1.73_m2} (ref >59)
GLOBULIN, TOTAL: 3 g/dL (ref 1.5–4.5)
GLUCOSE: 136 mg/dL — AB (ref 65–99)
Potassium: 4.4 mmol/L (ref 3.5–5.2)
SODIUM: 141 mmol/L (ref 134–144)
Total Protein: 7.9 g/dL (ref 6.0–8.5)

## 2017-09-26 NOTE — Assessment & Plan Note (Signed)
Has been off statin for several months, will recheck enzymes and adjust as needed

## 2017-09-28 ENCOUNTER — Other Ambulatory Visit: Payer: Self-pay | Admitting: Family Medicine

## 2017-09-28 MED ORDER — PRAVASTATIN SODIUM 40 MG PO TABS: 40.0000 mg | ORAL_TABLET | Freq: Every day | ORAL | 1 refills | Status: DC

## 2017-09-28 NOTE — Telephone Encounter (Signed)
She needs a new refill on the Pravastatin. Needs 90 day for mail order.

## 2017-09-28 NOTE — Telephone Encounter (Signed)
Liver enzymes are still up, a bit higher than before so I don't think it has much to do with the cholesterol medicine. Can resume taking if it isn't causing her side effects. Continue to avoid tylenol. Has had hepatitis testing in past with negative results, can do an ultrasound at this point if desired but otherwise we can just continue to monitor as they are not significantly increasing.

## 2017-10-10 ENCOUNTER — Ambulatory Visit: Payer: Medicare PPO | Admitting: Family Medicine

## 2017-11-28 ENCOUNTER — Other Ambulatory Visit: Payer: Self-pay | Admitting: Family Medicine

## 2018-01-03 ENCOUNTER — Ambulatory Visit: Payer: Medicare PPO | Admitting: Family Medicine

## 2018-01-03 ENCOUNTER — Encounter: Payer: Self-pay | Admitting: Family Medicine

## 2018-01-03 VITALS — BP 162/73 | HR 79 | Temp 98.1°F | Wt 173.5 lb

## 2018-01-03 DIAGNOSIS — N390 Urinary tract infection, site not specified: Secondary | ICD-10-CM | POA: Diagnosis not present

## 2018-01-03 DIAGNOSIS — R300 Dysuria: Secondary | ICD-10-CM | POA: Diagnosis not present

## 2018-01-03 MED ORDER — CIPROFLOXACIN HCL 250 MG PO TABS: 250.0000 mg | ORAL_TABLET | Freq: Two times a day (BID) | ORAL | 0 refills | Status: DC

## 2018-01-03 MED ORDER — FLUCONAZOLE 150 MG PO TABS: 150.0000 mg | ORAL_TABLET | Freq: Once | ORAL | 0 refills | Status: AC

## 2018-01-03 NOTE — Progress Notes (Signed)
BP (!) 162/73 (BP Location: Right Arm, Patient Position: Sitting, Cuff Size: Normal)   Pulse 79   Temp 98.1 F (36.7 C) (Oral)   Wt 173 lb 8 oz (78.7 kg)   SpO2 98%   BMI 24.89 kg/m    Subjective:    Patient ID: Brianna Curry, female    DOB: 05-10-1941, 77 y.o.   MRN: [id number]  HPI: Brianna Curry is a 77 y.o. female  Chief Complaint  Patient presents with  . Dysuria    x's 2 days. Patient stated she woke up today and her pain was severe. Can only urine a little at a time.  . Bladder Pain   Dysuria, hematuria, and bladder pressure x 2 days. Drinking cranberry juice and water since onset. Denies fevers, chills, new back pain, N/V. Hx of UTIs with similar sxs.   Relevant past medical, surgical, family and social history reviewed and updated as indicated. Interim medical history since our last visit reviewed. Allergies and medications reviewed and updated.  Review of Systems  Constitutional: Negative.   Respiratory: Negative.   Cardiovascular: Negative.   Gastrointestinal: Negative.   Genitourinary: Positive for dysuria, hematuria and pelvic pain.  Musculoskeletal: Negative.   Neurological: Negative.   Psychiatric/Behavioral: Negative.    Per HPI unless specifically indicated above     Objective:    BP (!) 162/73 (BP Location: Right Arm, Patient Position: Sitting, Cuff Size: Normal)   Pulse 79   Temp 98.1 F (36.7 C) (Oral)   Wt 173 lb 8 oz (78.7 kg)   SpO2 98%   BMI 24.89 kg/m   Wt Readings from Last 3 Encounters:  01/03/18 173 lb 8 oz (78.7 kg)  09/23/17 170 lb (77.1 kg)  09/05/17 174 lb (78.9 kg)    Physical Exam  Constitutional: She is oriented to person, place, and time. She appears well-developed and well-nourished. No distress.  HENT:  Head: Atraumatic.  Eyes: Conjunctivae are normal. No scleral icterus.  Neck: Normal range of motion. Neck supple.  Cardiovascular: Normal rate.  Pulmonary/Chest: Effort normal and breath sounds normal. No  respiratory distress.  Abdominal: Soft. Bowel sounds are normal. There is no tenderness.  Musculoskeletal: Normal range of motion. She exhibits no tenderness (No CVA tenderness b/l).  Neurological: She is alert and oriented to person, place, and time.  Skin: Skin is warm and dry.  Psychiatric: She has a normal mood and affect. Her behavior is normal.  Nursing note and vitals reviewed.  Results for orders placed or performed in visit on 01/03/18  Microscopic Examination  Result Value Ref Range   WBC, UA >30 (A) 0 - 5 /hpf   RBC, UA >30 (A) 0 - 2 /hpf   Epithelial Cells (non renal) 0-10 0 - 10 /hpf   Renal Epithel, UA 0-10 (A) None seen /hpf   Bacteria, UA Moderate (A) None seen/Few  Urine Culture, Reflex  Result Value Ref Range   Urine Culture, Routine WILL FOLLOW   UA/M w/rflx Culture, Routine  Result Value Ref Range   Specific Gravity, UA 1.025 1.005 - 1.030   pH, UA 6.5 5.0 - 7.5   Color, UA Brown (A) Yellow   Appearance Ur Turbid (A) Clear   Leukocytes, UA 3+ (A) Negative   Protein, UA 2+ (A) Negative/Trace   Glucose, UA Negative Negative   Ketones, UA Trace (A) Negative   RBC, UA 3+ (A) Negative   Bilirubin, UA Negative Negative   Urobilinogen, Ur 1.0 0.2 - 1.0  mg/dL   Nitrite, UA Positive (A) Negative   Microscopic Examination See below:    Urinalysis Reflex Comment       Assessment & Plan:   Problem List Items Addressed This Visit    None    Visit Diagnoses    Acute lower UTI    -  Primary   U/A + for UTI. Will treat with cipro and await urine cx. Discussed probiotics, voiding fully and frequently, and drinking lots of water. F/u if no better   Relevant Orders   UA/M w/rflx Culture, Routine (Completed)       Follow up plan: Return if symptoms worsen or fail to improve.

## 2018-01-05 NOTE — Patient Instructions (Signed)
Follow up as needed

## 2018-01-08 LAB — UA/M W/RFLX CULTURE, ROUTINE
BILIRUBIN UA: NEGATIVE
Glucose, UA: NEGATIVE
Nitrite, UA: POSITIVE — AB
PH UA: 6.5 (ref 5.0–7.5)
Specific Gravity, UA: 1.025 (ref 1.005–1.030)
UUROB: 1 mg/dL (ref 0.2–1.0)

## 2018-01-08 LAB — URINE CULTURE, REFLEX

## 2018-01-08 LAB — MICROSCOPIC EXAMINATION: RBC, UA: >30 /hpf — AB (ref 0–?)

## 2018-01-09 ENCOUNTER — Other Ambulatory Visit: Payer: Self-pay | Admitting: Family Medicine

## 2018-01-09 ENCOUNTER — Telehealth: Payer: Self-pay | Admitting: Family Medicine

## 2018-01-09 MED ORDER — CIPROFLOXACIN HCL 250 MG PO TABS: 250.0000 mg | ORAL_TABLET | Freq: Two times a day (BID) | ORAL | 0 refills | Status: DC

## 2018-01-09 NOTE — Telephone Encounter (Signed)
RX sent, will need to drop by another specimen if no better after this

## 2018-01-09 NOTE — Telephone Encounter (Signed)
Called patient to see what symptoms she was having since she is requesting a refill on Cipro. No answer, left message for her to call back.

## 2018-01-09 NOTE — Telephone Encounter (Signed)
rx sent

## 2018-01-09 NOTE — Telephone Encounter (Signed)
Copied from [place] [phone]. Topic: Quick Communication - See Telephone Encounter >> Jan 09, 2018 12:41 PM Oliver Pila B wrote: CRM for notification. See Telephone encounter for: ciprofloxacin (CIPRO) 250 MG tablet [[id number]]  Pt is requesting refill b/c pt is not feeling better Contact pt to advise 01/09/18.

## 2018-01-09 NOTE — Telephone Encounter (Signed)
Patient was seen last week and treated for UTI- it was so severe that she was told she may need a retreatment if she did get better. She reports she is still having some burning and she states her urine is still darker than normal. She does report she is getting better- the blood is gone and the pain is better.

## 2018-01-10 NOTE — Telephone Encounter (Signed)
Patient notified

## 2018-01-10 NOTE — Telephone Encounter (Signed)
Message relayed to patient. Verbalized understanding and denied questions.   

## 2018-02-06 ENCOUNTER — Other Ambulatory Visit: Payer: Self-pay | Admitting: Family Medicine

## 2018-02-14 ENCOUNTER — Other Ambulatory Visit: Payer: Self-pay | Admitting: Family Medicine

## 2018-02-14 NOTE — Telephone Encounter (Signed)
Your patient 

## 2018-02-15 NOTE — Telephone Encounter (Signed)
Call pt  Apt with anyone

## 2018-02-17 ENCOUNTER — Encounter: Payer: Self-pay | Admitting: Family Medicine

## 2018-02-17 NOTE — Telephone Encounter (Signed)
Line busy. Letter mailed

## 2018-03-10 ENCOUNTER — Other Ambulatory Visit: Payer: Self-pay | Admitting: Family Medicine

## 2018-03-13 NOTE — Telephone Encounter (Signed)
pravastatin refill Last OV: 09/23/17- Per OV 09/17/17 Pt has been off statins for several months due to elevated liver enzymes. Letter sent 02/17/18 to make OV. Last Refill:02/15/18 #30 tablets Pharmacy:[facility] PCP: Dr Jeananne Rama  Sending back to ovvice for denial or approval

## 2018-03-14 ENCOUNTER — Telehealth: Payer: Self-pay | Admitting: Family Medicine

## 2018-03-14 NOTE — Telephone Encounter (Signed)
Copied from [place] [id number]. Topic: Inquiry >> Mar 14, 2018 10:53 AM Pricilla Handler wrote: Reason for CRM: Patient called requesting a refill of Pravastatin (PRAVACHOL) 40 MG tablet. Patient has contacted her pharmacy. They stated that there were no refills left. Patient's preferred pharmacy is [facility], [place] [phone] (Phone)     [phone] (Fax).

## 2018-03-14 NOTE — Telephone Encounter (Signed)
Rx refill request: Pravachol 40 mg  LOV:01/09/18 ( several acute visits) f/u on lipid due in 12/18 - last lipid 06/11/17 abn  PCP: [place]: verified

## 2018-03-15 MED ORDER — PRAVASTATIN SODIUM 40 MG PO TABS: 40.0000 mg | ORAL_TABLET | Freq: Every day | ORAL | 0 refills | Status: DC

## 2018-03-17 NOTE — Telephone Encounter (Signed)
Patient calling to notify that all of her medications are supposed to be sent as a 90 days supply. When they are sent as 30 she is charged $15 by [employer]

## 2018-03-19 MED ORDER — PRAVASTATIN SODIUM 40 MG PO TABS: 40.0000 mg | ORAL_TABLET | Freq: Every day | ORAL | 0 refills | Status: DC

## 2018-03-19 NOTE — Telephone Encounter (Signed)
apt 

## 2018-03-19 NOTE — Addendum Note (Signed)
Addended by: Golden Pop A on: 03/19/2018 06:41 PM   Modules accepted: Orders

## 2018-03-27 DIAGNOSIS — H40003 Preglaucoma, unspecified, bilateral: Secondary | ICD-10-CM | POA: Diagnosis not present

## 2018-04-10 ENCOUNTER — Other Ambulatory Visit: Payer: Self-pay | Admitting: Family Medicine

## 2018-04-10 DIAGNOSIS — E785 Hyperlipidemia, unspecified: Secondary | ICD-10-CM

## 2018-04-17 ENCOUNTER — Other Ambulatory Visit: Payer: Self-pay | Admitting: Family Medicine

## 2018-04-18 NOTE — Telephone Encounter (Signed)
Ziac 5-6.25 mg refill request  LOV 09/05/17 with Dr. Jeananne Rama  [facility] - [place], [place].

## 2018-05-16 ENCOUNTER — Telehealth: Payer: Self-pay | Admitting: Family Medicine

## 2018-05-16 NOTE — Telephone Encounter (Signed)
Copied from [place] [phone]. Topic: Medicare AWV >> May 16, 2018  2:22 PM Leo Rod wrote: Called to schedule Medicare Annual Wellness Visit with Nurse Health Advisor. If patient returns call, please note: their last AWV was on 06/13/17 please schedule AWV with [facility] any date after June 14 2018  Thank you! For any questions please contact: Jill Alexanders [phone]  Skype [email or web address]

## 2018-05-17 NOTE — Telephone Encounter (Signed)
Scheduled for 08/07/18

## 2018-06-05 ENCOUNTER — Other Ambulatory Visit: Payer: Self-pay | Admitting: Family Medicine

## 2018-06-06 NOTE — Telephone Encounter (Signed)
Pravastatin sodium refill Last Refill:03/19/18 # 90 no RF Last OV: 09/05/17 "pt not taking" (pt has upcoming appt 08/14/18) PCP: Dr Jeananne Rama MD [place]

## 2018-07-17 ENCOUNTER — Other Ambulatory Visit: Payer: Self-pay

## 2018-07-17 ENCOUNTER — Ambulatory Visit: Payer: Medicare PPO | Admitting: Family Medicine

## 2018-07-17 ENCOUNTER — Encounter: Payer: Self-pay | Admitting: Family Medicine

## 2018-07-17 VITALS — BP 147/82 | HR 71 | Temp 98.2°F | Ht 70.0 in | Wt 176.2 lb

## 2018-07-17 DIAGNOSIS — B353 Tinea pedis: Secondary | ICD-10-CM

## 2018-07-17 DIAGNOSIS — L03119 Cellulitis of unspecified part of limb: Secondary | ICD-10-CM

## 2018-07-17 MED ORDER — MUPIROCIN 2 % EX OINT: 1.0000 "application " | TOPICAL_OINTMENT | Freq: Two times a day (BID) | CUTANEOUS | 0 refills | Status: DC

## 2018-07-17 MED ORDER — DOXYCYCLINE HYCLATE 100 MG PO TABS: 100.0000 mg | ORAL_TABLET | Freq: Two times a day (BID) | ORAL | 0 refills | Status: DC

## 2018-07-17 MED ORDER — CLOTRIMAZOLE 1 % EX CREA: 1.0000 "application " | TOPICAL_CREAM | Freq: Two times a day (BID) | CUTANEOUS | 6 refills | Status: DC

## 2018-07-17 NOTE — Progress Notes (Signed)
BP (!) 147/82   Pulse 71   Temp 98.2 F (36.8 C) (Oral)   Ht 5\' 10"  (1.778 m)   Wt 176 lb 3.2 oz (79.9 kg)   SpO2 96%   BMI 25.28 kg/m    Subjective:    Patient ID: Brianna Curry, female    DOB: [birth year], 77 y.o.   MRN: [id number]  HPI: Brianna Curry is a 77 y.o. female  Chief Complaint  Patient presents with  . Foot Pain    right foot infection per patient   Pt here today with pain, swelling, redness, and blistering of right foot x 3-4 days. Trying hydrogen peroxide and avoiding socks with minimal relief. States she has this every summer and if she doesn't come in right away it will become an abscess. Denies fevers, chills, sweats, injury.   Relevant past medical, surgical, family and social history reviewed and updated as indicated. Interim medical history since our last visit reviewed. Allergies and medications reviewed and updated.  Review of Systems  Per HPI unless specifically indicated above     Objective:    BP (!) 147/82   Pulse 71   Temp 98.2 F (36.8 C) (Oral)   Ht 5\' 10"  (1.778 m)   Wt 176 lb 3.2 oz (79.9 kg)   SpO2 96%   BMI 25.28 kg/m   Wt Readings from Last 3 Encounters:  07/17/18 176 lb 3.2 oz (79.9 kg)  01/03/18 173 lb 8 oz (78.7 kg)  09/23/17 170 lb (77.1 kg)    Physical Exam  Constitutional: She is oriented to person, place, and time. She appears well-developed and well-nourished. No distress.  HENT:  Head: Atraumatic.  Eyes: Conjunctivae are normal.  Neck: Normal range of motion. Neck supple.  Cardiovascular: Normal rate and normal heart sounds.  Pulmonary/Chest: Effort normal and breath sounds normal.  Musculoskeletal: Normal range of motion. She exhibits tenderness. She exhibits no edema.  Neurological: She is alert and oriented to person, place, and time.  Skin: Skin is warm and dry. There is erythema.  Right foot erythematous with scabbing across base of toes. TTP over wound Scaly areas between toes  Psychiatric: She has a  normal mood and affect. Her behavior is normal.  Nursing note and vitals reviewed.   Results for orders placed or performed in visit on 01/03/18  Microscopic Examination  Result Value Ref Range   WBC, UA >30 (A) 0 - 5 /hpf   RBC, UA >30 (A) 0 - 2 /hpf   Epithelial Cells (non renal) 0-10 0 - 10 /hpf   Renal Epithel, UA 0-10 (A) None seen /hpf   Bacteria, UA Moderate (A) None seen/Few  Urine Culture, Reflex  Result Value Ref Range   Urine Culture, Routine Final report (A)    Organism ID, Bacteria Escherichia coli (A)    ORGANISM ID, BACTERIA Comment    Antimicrobial Susceptibility Comment   UA/M w/rflx Culture, Routine  Result Value Ref Range   Specific Gravity, UA 1.025 1.005 - 1.030   pH, UA 6.5 5.0 - 7.5   Color, UA Brown (A) Yellow   Appearance Ur Turbid (A) Clear   Leukocytes, UA 3+ (A) Negative   Protein, UA 2+ (A) Negative/Trace   Glucose, UA Negative Negative   Ketones, UA Trace (A) Negative   RBC, UA 3+ (A) Negative   Bilirubin, UA Negative Negative   Urobilinogen, Ur 1.0 0.2 - 1.0 mg/dL   Nitrite, UA Positive (A) Negative   Microscopic Examination  See below:    Urinalysis Reflex Comment       Assessment & Plan:   Problem List Items Addressed This Visit    None    Visit Diagnoses    Cellulitis of foot    -  Primary   Tx with doxycycline, neosporin, soaks. Avoid tight shoes, elevate legs. F/u if worsening or no improvement   Tinea pedis of right foot       Clotrimazole cream sent, suspect cellulitis from secondary infection of tinea. Keep feet clean and dry. Change shoes often   Relevant Medications   mupirocin ointment (BACTROBAN) 2 %   clotrimazole (LOTRIMIN) 1 % cream       Follow up plan: Return for as scheduled.

## 2018-07-20 NOTE — Patient Instructions (Signed)
Follow up as scheduled.  

## 2018-08-07 ENCOUNTER — Ambulatory Visit: Payer: Medicare PPO

## 2018-08-09 ENCOUNTER — Other Ambulatory Visit: Payer: Self-pay | Admitting: Family Medicine

## 2018-08-14 ENCOUNTER — Ambulatory Visit (INDEPENDENT_AMBULATORY_CARE_PROVIDER_SITE_OTHER): Payer: Medicare PPO

## 2018-08-14 ENCOUNTER — Ambulatory Visit (INDEPENDENT_AMBULATORY_CARE_PROVIDER_SITE_OTHER): Payer: Medicare PPO | Admitting: Family Medicine

## 2018-08-14 ENCOUNTER — Encounter: Payer: Self-pay | Admitting: Family Medicine

## 2018-08-14 ENCOUNTER — Other Ambulatory Visit: Payer: Self-pay

## 2018-08-14 VITALS — BP 157/78 | HR 66 | Temp 98.2°F | Ht 70.0 in | Wt 174.5 lb

## 2018-08-14 VITALS — BP 157/78 | HR 66 | Temp 98.2°F | Resp 16 | Ht 70.0 in | Wt 174.5 lb

## 2018-08-14 DIAGNOSIS — Z0000 Encounter for general adult medical examination without abnormal findings: Secondary | ICD-10-CM

## 2018-08-14 DIAGNOSIS — I10 Essential (primary) hypertension: Secondary | ICD-10-CM

## 2018-08-14 DIAGNOSIS — R300 Dysuria: Secondary | ICD-10-CM | POA: Diagnosis not present

## 2018-08-14 DIAGNOSIS — M5431 Sciatica, right side: Secondary | ICD-10-CM

## 2018-08-14 DIAGNOSIS — N390 Urinary tract infection, site not specified: Secondary | ICD-10-CM

## 2018-08-14 DIAGNOSIS — K219 Gastro-esophageal reflux disease without esophagitis: Secondary | ICD-10-CM | POA: Diagnosis not present

## 2018-08-14 DIAGNOSIS — E785 Hyperlipidemia, unspecified: Secondary | ICD-10-CM | POA: Diagnosis not present

## 2018-08-14 DIAGNOSIS — E782 Mixed hyperlipidemia: Secondary | ICD-10-CM

## 2018-08-14 LAB — UA/M W/RFLX CULTURE, ROUTINE
Bilirubin, UA: NEGATIVE
GLUCOSE, UA: NEGATIVE
Ketones, UA: NEGATIVE
Nitrite, UA: NEGATIVE
PROTEIN UA: NEGATIVE
RBC, UA: NEGATIVE
Specific Gravity, UA: 1.02 (ref 1.005–1.030)
Urobilinogen, Ur: 0.2 mg/dL (ref 0.2–1.0)
pH, UA: 5.5 (ref 5.0–7.5)

## 2018-08-14 LAB — MICROSCOPIC EXAMINATION

## 2018-08-14 MED ORDER — CIPROFLOXACIN HCL 250 MG PO TABS: 250.0000 mg | ORAL_TABLET | Freq: Two times a day (BID) | ORAL | 0 refills | Status: DC

## 2018-08-14 MED ORDER — PRAVASTATIN SODIUM 40 MG PO TABS: ORAL_TABLET | ORAL | 1 refills | Status: DC

## 2018-08-14 MED ORDER — FENOFIBRATE 160 MG PO TABS: 160.0000 mg | ORAL_TABLET | Freq: Every day | ORAL | 1 refills | Status: DC

## 2018-08-14 MED ORDER — OMEPRAZOLE 40 MG PO CPDR: 40.0000 mg | DELAYED_RELEASE_CAPSULE | Freq: Every day | ORAL | 3 refills | Status: DC

## 2018-08-14 MED ORDER — BISOPROLOL-HYDROCHLOROTHIAZIDE 10-6.25 MG PO TABS: 1.0000 | ORAL_TABLET | Freq: Two times a day (BID) | ORAL | 1 refills | Status: DC

## 2018-08-14 MED ORDER — ALBUTEROL SULFATE HFA 108 (90 BASE) MCG/ACT IN AERS: 2.0000 | INHALATION_SPRAY | RESPIRATORY_TRACT | 11 refills | Status: AC | PRN

## 2018-08-14 MED ORDER — TRIAMCINOLONE ACETONIDE 40 MG/ML IJ SUSP
40.0000 mg | Freq: Once | INTRAMUSCULAR | Status: AC
  Administered 2018-08-14: 40 mg via INTRAMUSCULAR

## 2018-08-14 NOTE — Patient Instructions (Signed)
Brianna Curry , Thank you for taking time to come for your Medicare Wellness Visit. I appreciate your ongoing commitment to your health goals. Please review the following plan we discussed and let me know if I can assist you in the future.   Screening recommendations/referrals: Colonoscopy: no longer required Mammogram: no longer required Bone Density: no longer required Recommended yearly ophthalmology/optometry visit for glaucoma screening and checkup Recommended yearly dental visit for hygiene and checkup  Vaccinations: Influenza vaccine: due 08/2018 Pneumococcal vaccine: up to date Tdap vaccine: up to date Shingles vaccine: shingrix eligible, check with your insurance company for coverage     Advanced directives: Advance directive discussed with you today. Even though you declined this today please call our office should you change your mind and we can give you the proper paperwork for you to fill out.  Conditions/risks identified: Recommend drinking at least 6-8 glasses of water a day   Next appointment: Follow up in one year for your annual wellness exam.    Preventive Care 65 Years and Older, Female Preventive care refers to lifestyle choices and visits with your health care provider that can promote health and wellness. What does preventive care include?  A yearly physical exam. This is also called an annual well check.  Dental exams once or twice a year.  Routine eye exams. Ask your health care provider how often you should have your eyes checked.  Personal lifestyle choices, including:  Daily care of your teeth and gums.  Regular physical activity.  Eating a healthy diet.  Avoiding tobacco and drug use.  Limiting alcohol use.  Practicing safe sex.  Taking low-dose aspirin every day.  Taking vitamin and mineral supplements as recommended by your health care provider. What happens during an annual well check? The services and screenings done by your health care  provider during your annual well check will depend on your age, overall health, lifestyle risk factors, and family history of disease. Counseling  Your health care provider may ask you questions about your:  Alcohol use.  Tobacco use.  Drug use.  Emotional well-being.  Home and relationship well-being.  Sexual activity.  Eating habits.  History of falls.  Memory and ability to understand (cognition).  Work and work Statistician.  Reproductive health. Screening  You may have the following tests or measurements:  Height, weight, and BMI.  Blood pressure.  Lipid and cholesterol levels. These may be checked every 5 years, or more frequently if you are over 63 years old.  Skin check.  Lung cancer screening. You may have this screening every year starting at age 78 if you have a 30-pack-year history of smoking and currently smoke or have quit within the past 15 years.  Fecal occult blood test (FOBT) of the stool. You may have this test every year starting at age 78.  Flexible sigmoidoscopy or colonoscopy. You may have a sigmoidoscopy every 5 years or a colonoscopy every 10 years starting at age 78.  Hepatitis C blood test.  Hepatitis B blood test.  Sexually transmitted disease (STD) testing.  Diabetes screening. This is done by checking your blood sugar (glucose) after you have not eaten for a while (fasting). You may have this done every 1-3 years.  Bone density scan. This is done to screen for osteoporosis. You may have this done starting at age 78.  Mammogram. This may be done every 1-2 years. Talk to your health care provider about how often you should have regular mammograms. Talk with  your health care provider about your test results, treatment options, and if necessary, the need for more tests. Vaccines  Your health care provider may recommend certain vaccines, such as:  Influenza vaccine. This is recommended every year.  Tetanus, diphtheria, and acellular  pertussis (Tdap, Td) vaccine. You may need a Td booster every 10 years.  Zoster vaccine. You may need this after age 78.  Pneumococcal 13-valent conjugate (PCV13) vaccine. One dose is recommended after age 78.  Pneumococcal polysaccharide (PPSV23) vaccine. One dose is recommended after age 78. Talk to your health care provider about which screenings and vaccines you need and how often you need them. This information is not intended to replace advice given to you by your health care provider. Make sure you discuss any questions you have with your health care provider. Document Released: 01/09/2016 Document Revised: 09/01/2016 Document Reviewed: 10/14/2015 Elsevier Interactive Patient Education  2017 [employer] Prevention in the Home Falls can cause injuries. They can happen to people of all ages. There are many things you can do to make your home safe and to help prevent falls. What can I do on the outside of my home?  Regularly fix the edges of walkways and driveways and fix any cracks.  Remove anything that might make you trip as you walk through a door, such as a raised step or threshold.  Trim any bushes or trees on the path to your home.  Use bright outdoor lighting.  Clear any walking paths of anything that might make someone trip, such as rocks or tools.  Regularly check to see if handrails are loose or broken. Make sure that both sides of any steps have handrails.  Any raised decks and porches should have guardrails on the edges.  Have any leaves, snow, or ice cleared regularly.  Use sand or salt on walking paths during winter.  Clean up any spills in your garage right away. This includes oil or grease spills. What can I do in the bathroom?  Use night lights.  Install grab bars by the toilet and in the tub and shower. Do not use towel bars as grab bars.  Use non-skid mats or decals in the tub or shower.  If you need to sit down in the shower, use a plastic,  non-slip stool.  Keep the floor dry. Clean up any water that spills on the floor as soon as it happens.  Remove soap buildup in the tub or shower regularly.  Attach bath mats securely with double-sided non-slip rug tape.  Do not have throw rugs and other things on the floor that can make you trip. What can I do in the bedroom?  Use night lights.  Make sure that you have a light by your bed that is easy to reach.  Do not use any sheets or blankets that are too big for your bed. They should not hang down onto the floor.  Have a firm chair that has side arms. You can use this for support while you get dressed.  Do not have throw rugs and other things on the floor that can make you trip. What can I do in the kitchen?  Clean up any spills right away.  Avoid walking on wet floors.  Keep items that you use a lot in easy-to-reach places.  If you need to reach something above you, use a strong step stool that has a grab bar.  Keep electrical cords out of the way.  Do not  use floor polish or wax that makes floors slippery. If you must use wax, use non-skid floor wax.  Do not have throw rugs and other things on the floor that can make you trip. What can I do with my stairs?  Do not leave any items on the stairs.  Make sure that there are handrails on both sides of the stairs and use them. Fix handrails that are broken or loose. Make sure that handrails are as long as the stairways.  Check any carpeting to make sure that it is firmly attached to the stairs. Fix any carpet that is loose or worn.  Avoid having throw rugs at the top or bottom of the stairs. If you do have throw rugs, attach them to the floor with carpet tape.  Make sure that you have a light switch at the top of the stairs and the bottom of the stairs. If you do not have them, ask someone to add them for you. What else can I do to help prevent falls?  Wear shoes that:  Do not have high heels.  Have rubber  bottoms.  Are comfortable and fit you well.  Are closed at the toe. Do not wear sandals.  If you use a stepladder:  Make sure that it is fully opened. Do not climb a closed stepladder.  Make sure that both sides of the stepladder are locked into place.  Ask someone to hold it for you, if possible.  Clearly mark and make sure that you can see:  Any grab bars or handrails.  First and last steps.  Where the edge of each step is.  Use tools that help you move around (mobility aids) if they are needed. These include:  Canes.  Walkers.  Scooters.  Crutches.  Turn on the lights when you go into a dark area. Replace any light bulbs as soon as they burn out.  Set up your furniture so you have a clear path. Avoid moving your furniture around.  If any of your floors are uneven, fix them.  If there are any pets around you, be aware of where they are.  Review your medicines with your doctor. Some medicines can make you feel dizzy. This can increase your chance of falling. Ask your doctor what other things that you can do to help prevent falls. This information is not intended to replace advice given to you by your health care provider. Make sure you discuss any questions you have with your health care provider. Document Released: 10/09/2009 Document Revised: 05/20/2016 Document Reviewed: 01/17/2015 Elsevier Interactive Patient Education  2017 [employer].

## 2018-08-14 NOTE — Progress Notes (Signed)
BP (!) 157/78 (BP Location: Right Arm, Cuff Size: Normal)   Pulse 66   Temp 98.2 F (36.8 C) (Oral)   Ht 5\' 10"  (1.778 m)   Wt 174 lb 8 oz (79.2 kg)   SpO2 93%   BMI 25.04 kg/m    Subjective:    Patient ID: Brianna Curry, female    DOB: 1941-04-04, 77 y.o.   MRN: [id number]  HPI: Brianna Curry is a 77 y.o. female presenting on 08/14/2018 for comprehensive medical examination. Current medical complaints include:see below  BPs above normal when checked at home and in clinic the past few months. Compliant with her ziac and tries to adhere to good diet and exercise. Has only ever tolerated the ziac for her BP. Denies CP, syncope, SOB.   Taking pravastatin for her cholesterol without issue. No myalgias, claudication.   Having another sciatica flare down right leg. Using salon pas patches and OTC pain relievers with very mild relief. Muscle relaxers made her too sleepy. Denies saddle paresthesias, bowel/bladder incontinence, fevers.   Has been having some bladder issues with dysuria, suprapubic abd pain, and frequency. AZO seemed to have helped quite a bit the past few days. Denies fevers, N/V, hematuria.    She currently lives with: alone Menopausal Symptoms: no  Depression Screen done today and results listed below:  Depression screen [facility] 2/9 08/14/2018 09/05/2017 06/13/2017 05/17/2016  Decreased Interest 0 0 0 0  Down, Depressed, Hopeless 0 0 0 0  PHQ - 2 Score 0 0 0 0  Altered sleeping 0 - - -  Tired, decreased energy 1 - - -  Change in appetite 0 - - -  Feeling bad or failure about yourself  0 - - -  Trouble concentrating 0 - - -  Moving slowly or fidgety/restless 0 - - -  Suicidal thoughts 0 - - -  PHQ-9 Score 1 - - -    The patient does not have a history of falls. I did not complete a risk assessment for falls. A plan of care for falls was not documented.   Past Medical History:  Past Medical History:  Diagnosis Date  . Anxiety   . GERD (gastroesophageal reflux  disease)   . Glaucoma   . Hyperlipidemia   . Hypertension   . IBS (irritable bowel syndrome)     Surgical History:  Past Surgical History:  Procedure Laterality Date  . COLONOSCOPY    . NASAL SINUS SURGERY  1976    Medications:  Current Outpatient Medications on File Prior to Visit  Medication Sig  . aspirin EC 81 MG tablet Take 81 mg by mouth daily.  Mariane Baumgarten Calcium (STOOL SOFTENER PO) Take by mouth daily.  Marland Kitchen FIBER PO Take by mouth daily.  Marland Kitchen latanoprost (XALATAN) 0.005 % ophthalmic solution   . Multiple Vitamin (MULTIVITAMIN) tablet Take 1 tablet by mouth daily.   No current facility-administered medications on file prior to visit.     Allergies:  Allergies  Allergen Reactions  . Sulfa Antibiotics   . Zithromax [Azithromycin]   . Penicillins Rash    Social History:  Social History   Socioeconomic History  . Marital status: Single    Spouse name: Not on file  . Number of children: Not on file  . Years of education: Not on file  . Highest education level: High school graduate  Occupational History  . Not on file  Social Needs  . Financial resource strain: Not hard at all  .  Food insecurity:    Worry: Never true    Inability: Never true  . Transportation needs:    Medical: No    Non-medical: No  Tobacco Use  . Smoking status: Never Smoker  . Smokeless tobacco: Never Used  Substance and Sexual Activity  . Alcohol use: No  . Drug use: No  . Sexual activity: Not on file  Lifestyle  . Physical activity:    Days per week: 0 days    Minutes per session: 0 min  . Stress: Not at all  Relationships  . Social connections:    Talks on phone: More than three times a week    Gets together: More than three times a week    Attends religious service: More than 4 times per year    Active member of club or organization: No    Attends meetings of clubs or organizations: Never    Relationship status: Never married  . Intimate partner violence:    Fear of current  or ex partner: No    Emotionally abused: No    Physically abused: No    Forced sexual activity: No  Other Topics Concern  . Not on file  Social History Narrative   Lives with brother    Social History   Tobacco Use  Smoking Status Never Smoker  Smokeless Tobacco Never Used   Social History   Substance and Sexual Activity  Alcohol Use No    Family History:  Family History  Problem Relation Age of Onset  . Hypertension Mother   . Heart attack Mother   . Hypertension Father   . Heart attack Father   . Glaucoma Maternal Grandmother   . Heart attack Maternal Grandmother   . Heart disease Maternal Grandmother   . Hypertension Maternal Grandmother   . Stroke Paternal Grandmother   . Stroke Paternal Grandfather     Past medical history, surgical history, medications, allergies, family history and social history reviewed with patient today and changes made to appropriate areas of the chart.   Review of Systems - General ROS: negative Psychological ROS: negative Ophthalmic ROS: negative ENT ROS: negative Allergy and Immunology ROS: negative Breast ROS: negative for breast lumps Respiratory ROS: no cough, shortness of breath, or wheezing Cardiovascular ROS: no chest pain or dyspnea on exertion Gastrointestinal ROS: no abdominal pain, change in bowel habits, or black or bloody stools Genito-Urinary ROS: positive for - dysuria and urinary frequency/urgency Musculoskeletal ROS: positive for - back pain radiating down right leg Neurological ROS: no TIA or stroke symptoms Dermatological ROS: negative All other ROS negative except what is listed above and in the HPI.      Objective:    BP (!) 157/78 (BP Location: Right Arm, Cuff Size: Normal)   Pulse 66   Temp 98.2 F (36.8 C) (Oral)   Ht 5\' 10"  (1.778 m)   Wt 174 lb 8 oz (79.2 kg)   SpO2 93%   BMI 25.04 kg/m   Wt Readings from Last 3 Encounters:  08/14/18 174 lb 8 oz (79.2 kg)  08/14/18 174 lb 8 oz (79.2 kg)    07/17/18 176 lb 3.2 oz (79.9 kg)    Physical Exam  Constitutional: She is oriented to person, place, and time. She appears well-developed and well-nourished. No distress.  HENT:  Head: Atraumatic.  Right Ear: External ear normal.  Left Ear: External ear normal.  Nose: Nose normal.  Mouth/Throat: Oropharynx is clear and moist. No oropharyngeal exudate.  Eyes: Pupils are  equal, round, and reactive to light. Conjunctivae are normal. No scleral icterus.  Neck: Normal range of motion. Neck supple. No thyromegaly present.  Cardiovascular: Normal rate, regular rhythm, normal heart sounds and intact distal pulses.  Pulmonary/Chest: Effort normal and breath sounds normal. No respiratory distress.  Abdominal: Soft. Bowel sounds are normal. She exhibits no mass. There is no tenderness.  Musculoskeletal: Normal range of motion. She exhibits no edema or tenderness.  Lymphadenopathy:    She has no cervical adenopathy.  Neurological: She is alert and oriented to person, place, and time. No cranial nerve deficit.  Skin: Skin is warm and dry. No rash noted.  Psychiatric: She has a normal mood and affect. Her behavior is normal.  Nursing note and vitals reviewed.   Results for orders placed or performed in visit on 08/14/18  Urine Culture  Result Value Ref Range   Urine Culture, Routine Final report (A)    Organism ID, Bacteria Comment (A)   Microscopic Examination  Result Value Ref Range   WBC, UA 6-10 (A) 0 - 5 /hpf   RBC, UA 0-2 0 - 2 /hpf   Epithelial Cells (non renal) 0-10 0 - 10 /hpf   Renal Epithel, UA 0-10 (A) None seen /hpf   Bacteria, UA Moderate (A) None seen/Few  CBC with Differential/Platelet  Result Value Ref Range   WBC 3.3 (L) 3.4 - 10.8 x10E3/uL   RBC 4.87 3.77 - 5.28 x10E6/uL   Hemoglobin 14.6 11.1 - 15.9 g/dL   Hematocrit 43.0 34.0 - 46.6 %   MCV 88 79 - 97 fL   MCH 30.0 26.6 - 33.0 pg   MCHC 34.0 31.5 - 35.7 g/dL   RDW 13.0 12.3 - 15.4 %   Platelets 236 150 - 450  x10E3/uL   Neutrophils 28 Not Estab. %   Lymphs 52 Not Estab. %   Monocytes 12 Not Estab. %   Eos 6 Not Estab. %   Basos 1 Not Estab. %   Neutrophils Absolute 0.9 (L) 1.4 - 7.0 x10E3/uL   Lymphocytes Absolute 1.7 0.7 - 3.1 x10E3/uL   Monocytes Absolute 0.4 0.1 - 0.9 x10E3/uL   EOS (ABSOLUTE) 0.2 0.0 - 0.4 x10E3/uL   Basophils Absolute 0.0 0.0 - 0.2 x10E3/uL   Immature Granulocytes 1 Not Estab. %   Immature Grans (Abs) 0.0 0.0 - 0.1 x10E3/uL   Hematology Comments: Note:   Comprehensive metabolic panel  Result Value Ref Range   Glucose 107 (H) 65 - 99 mg/dL   BUN 15 8 - 27 mg/dL   Creatinine, Ser 0.92 0.57 - 1.00 mg/dL   GFR calc non Af Amer 60 >59 mL/min/1.73   GFR calc Af Amer 69 >59 mL/min/1.73   BUN/Creatinine Ratio 16 12 - 28   Sodium 142 134 - 144 mmol/L   Potassium 4.4 3.5 - 5.2 mmol/L   Chloride 102 96 - 106 mmol/L   CO2 22 20 - 29 mmol/L   Calcium 10.3 8.7 - 10.3 mg/dL   Total Protein 8.0 6.0 - 8.5 g/dL   Albumin 4.8 3.5 - 4.8 g/dL   Globulin, Total 3.2 1.5 - 4.5 g/dL   Albumin/Globulin Ratio 1.5 1.2 - 2.2   Bilirubin Total 0.6 0.0 - 1.2 mg/dL   Alkaline Phosphatase 32 (L) 39 - 117 IU/L   AST 73 (H) 0 - 40 IU/L   ALT 51 (H) 0 - 32 IU/L  Lipid Panel w/o Chol/HDL Ratio  Result Value Ref Range   Cholesterol, Total 177 100 -  199 mg/dL   Triglycerides 155 (H) 0 - 149 mg/dL   HDL 38 (L) >39 mg/dL   VLDL Cholesterol Cal 31 5 - 40 mg/dL   LDL Calculated 108 (H) 0 - 99 mg/dL  UA/M w/rflx Culture, Routine  Result Value Ref Range   Specific Gravity, UA 1.020 1.005 - 1.030   pH, UA 5.5 5.0 - 7.5   Color, UA Orange Yellow   Appearance Ur Cloudy (A) Clear   Leukocytes, UA 2+ (A) Negative   Protein, UA Negative Negative/Trace   Glucose, UA Negative Negative   Ketones, UA Negative Negative   RBC, UA Negative Negative   Bilirubin, UA Negative Negative   Urobilinogen, Ur 0.2 0.2 - 1.0 mg/dL   Nitrite, UA Negative Negative   Microscopic Examination See below:         Assessment & Plan:   Problem List Items Addressed This Visit      Cardiovascular and Mediastinum   Hypertension - Primary    BPs not at goal. Will increase ziac and continue working on [employer]. Continue home monitoring.       Relevant Medications   bisoprolol-hydrochlorothiazide (ZIAC) 10-6.25 MG tablet   fenofibrate 160 MG tablet   pravastatin (PRAVACHOL) 40 MG tablet   Other Relevant Orders   CBC with Differential/Platelet (Completed)   Comprehensive metabolic panel (Completed)   UA/M w/rflx Culture, Routine (Completed)     Digestive   GERD (gastroesophageal reflux disease)    Continue prilosec, under good control      Relevant Medications   omeprazole (PRILOSEC) 40 MG capsule     Other   Hyperlipidemia    Recheck lipids, adjust medication as needed. Continue current regimen      Relevant Medications   bisoprolol-hydrochlorothiazide (ZIAC) 10-6.25 MG tablet   fenofibrate 160 MG tablet   pravastatin (PRAVACHOL) 40 MG tablet   Other Relevant Orders   Lipid Panel w/o Chol/HDL Ratio (Completed)    Other Visit Diagnoses    Annual physical exam       Acute lower UTI       U/A showing UTI, await cx and start course of cipro. Push fluids, probiotics.    Relevant Orders   Urine Culture (Completed)   Sciatica of right side       Intolerant to muscle relaxers. Continue patches, OTC pain relievers, stretches. IM Kenalog given today.    Relevant Medications   triamcinolone acetonide (KENALOG-40) injection 40 mg (Completed)       Follow up plan: Return in about 4 weeks (around 09/11/2018) for BP f/u.   LABORATORY TESTING:  - Pap smear: not applicable  IMMUNIZATIONS:   - Tdap: Tetanus vaccination status reviewed: last tetanus booster within 10 years. - Influenza: Postponed to flu season - Pneumovax: Up to date - Prevnar: Up to date - HPV: Not applicable - Zostavax vaccine: Up to date  SCREENING: -Mammogram: Not applicable  - Colonoscopy: Refused  - Bone  Density: Up to date   PATIENT COUNSELING:   Advised to take 1 mg of folate supplement per day if capable of pregnancy.   Sexuality: Discussed sexually transmitted diseases, partner selection, use of condoms, avoidance of unintended pregnancy  and contraceptive alternatives.   Advised to avoid cigarette smoking.  I discussed with the patient that most people either abstain from alcohol or drink within safe limits (<=14/week and <=4 drinks/occasion for males, <=7/weeks and <= 3 drinks/occasion for females) and that the risk for alcohol disorders and other health effects rises  proportionally with the number of drinks per week and how often a drinker exceeds daily limits.  Discussed cessation/primary prevention of drug use and availability of treatment for abuse.   Diet: Encouraged to adjust caloric intake to maintain  or achieve ideal body weight, to reduce intake of dietary saturated fat and total fat, to limit sodium intake by avoiding high sodium foods and not adding table salt, and to maintain adequate dietary potassium and calcium preferably from fresh fruits, vegetables, and low-fat dairy products.    stressed the importance of regular exercise  Injury prevention: Discussed safety belts, safety helmets, smoke detector, smoking near bedding or upholstery.   Dental health: Discussed importance of regular tooth brushing, flossing, and dental visits.    NEXT PREVENTATIVE PHYSICAL DUE IN 1 YEAR. Return in about 4 weeks (around 09/11/2018) for BP f/u.

## 2018-08-14 NOTE — Progress Notes (Addendum)
Subjective:   Brianna Curry is a 77 y.o. female who presents for Medicare Annual (Subsequent) preventive examination.  Review of Systems:   Cardiac Risk Factors include: advanced age (>34men, >2 women);hypertension;dyslipidemia;obesity (BMI >30kg/m2)     Objective:     Vitals: There were no vitals taken for this visit.  There is no height or weight on file to calculate BMI.  Advanced Directives 08/14/2018 07/26/2017 11/20/2016  Does Patient Have a Medical Advance Directive? No No No  Would patient like information on creating a medical advance directive? No - Patient declined - -    Tobacco Social History   Tobacco Use  Smoking Status Never Smoker  Smokeless Tobacco Never Used     Counseling given: Not Answered   Clinical Intake:                       Past Medical History:  Diagnosis Date  . Anxiety   . GERD (gastroesophageal reflux disease)   . Glaucoma   . Hyperlipidemia   . Hypertension   . IBS (irritable bowel syndrome)    Past Surgical History:  Procedure Laterality Date  . COLONOSCOPY    . NASAL SINUS SURGERY  1976   Family History  Problem Relation Age of Onset  . Hypertension Mother   . Heart attack Mother   . Hypertension Father   . Heart attack Father   . Glaucoma Maternal Grandmother   . Heart attack Maternal Grandmother   . Heart disease Maternal Grandmother   . Hypertension Maternal Grandmother   . Stroke Paternal Grandmother   . Stroke Paternal Grandfather    Social History   Socioeconomic History  . Marital status: Single    Spouse name: Not on file  . Number of children: Not on file  . Years of education: Not on file  . Highest education level: High school graduate  Occupational History  . Not on file  Social Needs  . Financial resource strain: Not hard at all  . Food insecurity:    Worry: Never true    Inability: Never true  . Transportation needs:    Medical: No    Non-medical: No  Tobacco Use  . Smoking  status: Never Smoker  . Smokeless tobacco: Never Used  Substance and Sexual Activity  . Alcohol use: No  . Drug use: No  . Sexual activity: Not on file  Lifestyle  . Physical activity:    Days per week: 0 days    Minutes per session: 0 min  . Stress: Not at all  Relationships  . Social connections:    Talks on phone: More than three times a week    Gets together: More than three times a week    Attends religious service: More than 4 times per year    Active member of club or organization: No    Attends meetings of clubs or organizations: Never    Relationship status: Never married  Other Topics Concern  . Not on file  Social History Narrative   Lives with brother     Outpatient Encounter Medications as of 08/14/2018  Medication Sig  . albuterol (PROVENTIL HFA;VENTOLIN HFA) 108 (90 Base) MCG/ACT inhaler Inhale 2 puffs into the lungs every 4 (four) hours as needed for wheezing or shortness of breath.  Marland Kitchen aspirin EC 81 MG tablet Take 81 mg by mouth daily.  . bisoprolol-hydrochlorothiazide (ZIAC) 10-6.25 MG tablet Take 1 tablet by mouth 2 (two) times daily.  Marland Kitchen  ciprofloxacin (CIPRO) 250 MG tablet Take 1 tablet (250 mg total) by mouth 2 (two) times daily.  Mariane Baumgarten Calcium (STOOL SOFTENER PO) Take by mouth daily.  . fenofibrate 160 MG tablet Take 1 tablet (160 mg total) by mouth daily.  Marland Kitchen FIBER PO Take by mouth daily.  Marland Kitchen latanoprost (XALATAN) 0.005 % ophthalmic solution   . Multiple Vitamin (MULTIVITAMIN) tablet Take 1 tablet by mouth daily.  Marland Kitchen omeprazole (PRILOSEC) 40 MG capsule Take 1 capsule (40 mg total) by mouth daily.  . pravastatin (PRAVACHOL) 40 MG tablet TAKE 1 TABLET (40 MG TOTAL) BY MOUTH DAILY   No facility-administered encounter medications on file as of 08/14/2018.     Activities of Daily Living In your present state of health, do you have any difficulty performing the following activities: 08/14/2018 08/14/2018  Hearing? N N  Vision? Y N  Comment glacuoma and  cataracts  -  Difficulty concentrating or making decisions? N N  Walking or climbing stairs? Y Y  Comment back pain  -  Dressing or bathing? N N  Doing errands, shopping? N N  Preparing Food and eating ? N -  Using the Toilet? N -  In the past six months, have you accidently leaked urine? Y -  Comment wears pads when she has an uti -  Do you have problems with loss of bowel control? N -  Managing your Medications? N -  Managing your Finances? N -  Housekeeping or managing your Housekeeping? N -  Some recent data might be hidden    Patient Care Team: Guadalupe Maple, MD as PCP - General (Family Medicine) Jannet Mantis, MD (Dermatology) Karren Burly Deirdre Peer, MD as Referring Physician (Ophthalmology)    Assessment:   This is a routine wellness examination for [name].  Exercise Activities and Dietary recommendations Current Exercise Habits: The patient does not participate in regular exercise at present, Exercise limited by: None identified  Goals    . DIET - INCREASE WATER INTAKE     Recommend drinking at least 6-8 glasses of water a day        Fall Risk Fall Risk  09/05/2017 06/13/2017 05/17/2016  Falls in the past year? No No No   Is the patient's home free of loose throw rugs in walkways, pet beds, electrical cords, etc?   yes      Grab bars in the bathroom? no      Handrails on the stairs?   no stairs       Adequate lighting?   yes  Timed Get Up and Go performed: Completed in 8 seconds with no use of assistive devices, steady gait. No intervention needed at this time.   Depression Screen PHQ 2/9 Scores 08/14/2018 09/05/2017 06/13/2017 05/17/2016  PHQ - 2 Score 0 0 0 0  PHQ- 9 Score 1 - - -     Cognitive Function     6CIT Screen 08/14/2018  What Year? 0 points  What month? 0 points  What time? 0 points  Count back from 20 0 points  Months in reverse 0 points  Repeat phrase 0 points  Total Score 0    Immunization History  Administered Date(s)  Administered  . Pneumococcal Conjugate-13 05/17/2016  . Pneumococcal-Unspecified 08/24/2012  . Td 08/18/2009  . Zoster 04/24/2007    Qualifies for Shingles Vaccine? Yes, discussed shingrix vaccine   Screening Tests Health Maintenance  Topic Date Due  . INFLUENZA VACCINE  07/27/2018  . TETANUS/TDAP  08/19/2019  .  DEXA SCAN  Completed  . PNA vac Low Risk Adult  Completed    Cancer Screenings: Lung: Low Dose CT Chest recommended if Age 95-80 years, 30 pack-year currently smoking OR have quit w/in 15years. Patient does not qualify. Breast:  Up to date on Mammogram? Yes   Up to date of Bone Density/Dexa? Yes 10/02/2010 Colorectal: no longer required  Additional Screenings:  Hepatitis C Screening: not indicated      Plan:    I have personally reviewed and addressed the Medicare Annual Wellness questionnaire and have noted the following in the patient's chart:  A. Medical and social history B. Use of alcohol, tobacco or illicit drugs  C. Current medications and supplements D. Functional ability and status E.  Nutritional status F.  Physical activity G. Advance directives H. List of other physicians I.  Hospitalizations, surgeries, and ER visits in previous 12 months J.  [place] such as hearing and vision if needed, cognitive and depression L. Referrals and appointments   In addition, I have reviewed and discussed with patient certain preventive protocols, quality metrics, and best practice recommendations. A written personalized care plan for preventive services as well as general preventive health recommendations were provided to patient.   Signed,  Tyler Aas, LPN Nurse Health Advisor   Nurse Notes:none

## 2018-08-15 ENCOUNTER — Encounter: Payer: Self-pay | Admitting: Family Medicine

## 2018-08-15 LAB — CBC WITH DIFFERENTIAL/PLATELET
BASOS ABS: 0 10*3/uL (ref 0.0–0.2)
BASOS: 1 %
EOS (ABSOLUTE): 0.2 10*3/uL (ref 0.0–0.4)
Eos: 6 %
Hematocrit: 43 % (ref 34.0–46.6)
Hemoglobin: 14.6 g/dL (ref 11.1–15.9)
IMMATURE GRANS (ABS): 0 10*3/uL (ref 0.0–0.1)
Immature Granulocytes: 1 %
LYMPHS: 52 %
Lymphocytes Absolute: 1.7 10*3/uL (ref 0.7–3.1)
MCH: 30 pg (ref 26.6–33.0)
MCHC: 34 g/dL (ref 31.5–35.7)
MCV: 88 fL (ref 79–97)
MONOCYTES: 12 %
Monocytes Absolute: 0.4 10*3/uL (ref 0.1–0.9)
NEUTROS ABS: 0.9 10*3/uL — AB (ref 1.4–7.0)
Neutrophils: 28 %
PLATELETS: 236 10*3/uL (ref 150–450)
RBC: 4.87 x10E6/uL (ref 3.77–5.28)
RDW: 13 % (ref 12.3–15.4)
WBC: 3.3 10*3/uL — ABNORMAL LOW (ref 3.4–10.8)

## 2018-08-15 LAB — LIPID PANEL W/O CHOL/HDL RATIO
Cholesterol, Total: 177 mg/dL (ref 100–199)
HDL: 38 mg/dL — ABNORMAL LOW (ref >39)
LDL Calculated: 108 mg/dL — ABNORMAL HIGH (ref 0–99)
Triglycerides: 155 mg/dL — ABNORMAL HIGH (ref 0–149)
VLDL Cholesterol Cal: 31 mg/dL (ref 5–40)

## 2018-08-15 LAB — COMPREHENSIVE METABOLIC PANEL
A/G RATIO: 1.5 (ref 1.2–2.2)
ALT: 51 IU/L — AB (ref 0–32)
AST: 73 IU/L — ABNORMAL HIGH (ref 0–40)
Albumin: 4.8 g/dL (ref 3.5–4.8)
Alkaline Phosphatase: 32 IU/L — ABNORMAL LOW (ref 39–117)
BUN / CREAT RATIO: 16 (ref 12–28)
BUN: 15 mg/dL (ref 8–27)
Bilirubin Total: 0.6 mg/dL (ref 0.0–1.2)
CALCIUM: 10.3 mg/dL (ref 8.7–10.3)
CHLORIDE: 102 mmol/L (ref 96–106)
CO2: 22 mmol/L (ref 20–29)
Creatinine, Ser: 0.92 mg/dL (ref 0.57–1.00)
GFR, EST AFRICAN AMERICAN: 69 mL/min/{1.73_m2} (ref >59)
GFR, EST NON AFRICAN AMERICAN: 60 mL/min/{1.73_m2} (ref >59)
GLOBULIN, TOTAL: 3.2 g/dL (ref 1.5–4.5)
Glucose: 107 mg/dL — ABNORMAL HIGH (ref 65–99)
POTASSIUM: 4.4 mmol/L (ref 3.5–5.2)
SODIUM: 142 mmol/L (ref 134–144)
TOTAL PROTEIN: 8 g/dL (ref 6.0–8.5)

## 2018-08-16 LAB — URINE CULTURE

## 2018-08-17 NOTE — Assessment & Plan Note (Signed)
Recheck lipids, adjust medication as needed. Continue current regimen

## 2018-08-17 NOTE — Assessment & Plan Note (Signed)
BPs not at goal. Will increase ziac and continue working on [employer]. Continue home monitoring.

## 2018-08-17 NOTE — Patient Instructions (Signed)
Follow up in 1 month   

## 2018-08-17 NOTE — Assessment & Plan Note (Signed)
Continue prilosec, under good control

## 2018-09-18 ENCOUNTER — Ambulatory Visit: Payer: Medicare PPO | Admitting: Family Medicine

## 2018-09-18 ENCOUNTER — Encounter: Payer: Self-pay | Admitting: Family Medicine

## 2018-09-18 VITALS — BP 146/77 | HR 55 | Temp 98.0°F | Ht 70.0 in | Wt 175.8 lb

## 2018-09-18 DIAGNOSIS — I10 Essential (primary) hypertension: Secondary | ICD-10-CM

## 2018-09-18 NOTE — Progress Notes (Signed)
BP (!) 146/77   Pulse (!) 55   Temp 98 F (36.7 C) (Oral)   Ht 5\' 10"  (1.778 m)   Wt 175 lb 12.8 oz (79.7 kg)   SpO2 95%   BMI 25.22 kg/m    Subjective:    Patient ID: Brianna Curry, female    DOB: 05/23/1941, 77 y.o.   MRN: [id number]  HPI: Brianna Curry is a 77 y.o. female  Chief Complaint  Patient presents with  . Hypertension    Patient states no complaints.    Here today for BP f/u after increasing her ziac dose. Getting 130s/60s at home, doing very well on increased dose. No side effects, taking faithfully. Denies dizziness, HAs, CP, SOB, syncope. Continuing to stay very active and tries to eat salt restricted diet.   Relevant past medical, surgical, family and social history reviewed and updated as indicated. Interim medical history since our last visit reviewed. Allergies and medications reviewed and updated.  Review of Systems  Per HPI unless specifically indicated above     Objective:    BP (!) 146/77   Pulse (!) 55   Temp 98 F (36.7 C) (Oral)   Ht 5\' 10"  (1.778 m)   Wt 175 lb 12.8 oz (79.7 kg)   SpO2 95%   BMI 25.22 kg/m   Wt Readings from Last 3 Encounters:  09/18/18 175 lb 12.8 oz (79.7 kg)  08/14/18 174 lb 8 oz (79.2 kg)  08/14/18 174 lb 8 oz (79.2 kg)    Physical Exam  Constitutional: She is oriented to person, place, and time. She appears well-developed and well-nourished. No distress.  HENT:  Head: Atraumatic.  Eyes: Conjunctivae and EOM are normal.  Neck: Normal range of motion. Neck supple.  Cardiovascular: Normal rate and regular rhythm.  Pulmonary/Chest: Effort normal and breath sounds normal.  Musculoskeletal: Normal range of motion.  Neurological: She is alert and oriented to person, place, and time.  Skin: Skin is warm and dry.  Psychiatric: She has a normal mood and affect. Her behavior is normal.  Nursing note and vitals reviewed.  Results for orders placed or performed in visit on 08/14/18  Urine Culture  Result Value Ref  Range   Urine Culture, Routine Final report (A)    Organism ID, Bacteria Comment (A)   Microscopic Examination  Result Value Ref Range   WBC, UA 6-10 (A) 0 - 5 /hpf   RBC, UA 0-2 0 - 2 /hpf   Epithelial Cells (non renal) 0-10 0 - 10 /hpf   Renal Epithel, UA 0-10 (A) None seen /hpf   Bacteria, UA Moderate (A) None seen/Few  CBC with Differential/Platelet  Result Value Ref Range   WBC 3.3 (L) 3.4 - 10.8 x10E3/uL   RBC 4.87 3.77 - 5.28 x10E6/uL   Hemoglobin 14.6 11.1 - 15.9 g/dL   Hematocrit 43.0 34.0 - 46.6 %   MCV 88 79 - 97 fL   MCH 30.0 26.6 - 33.0 pg   MCHC 34.0 31.5 - 35.7 g/dL   RDW 13.0 12.3 - 15.4 %   Platelets 236 150 - 450 x10E3/uL   Neutrophils 28 Not Estab. %   Lymphs 52 Not Estab. %   Monocytes 12 Not Estab. %   Eos 6 Not Estab. %   Basos 1 Not Estab. %   Neutrophils Absolute 0.9 (L) 1.4 - 7.0 x10E3/uL   Lymphocytes Absolute 1.7 0.7 - 3.1 x10E3/uL   Monocytes Absolute 0.4 0.1 - 0.9 x10E3/uL  EOS (ABSOLUTE) 0.2 0.0 - 0.4 x10E3/uL   Basophils Absolute 0.0 0.0 - 0.2 x10E3/uL   Immature Granulocytes 1 Not Estab. %   Immature Grans (Abs) 0.0 0.0 - 0.1 x10E3/uL   Hematology Comments: Note:   Comprehensive metabolic panel  Result Value Ref Range   Glucose 107 (H) 65 - 99 mg/dL   BUN 15 8 - 27 mg/dL   Creatinine, Ser 0.92 0.57 - 1.00 mg/dL   GFR calc non Af Amer 60 >59 mL/min/1.73   GFR calc Af Amer 69 >59 mL/min/1.73   BUN/Creatinine Ratio 16 12 - 28   Sodium 142 134 - 144 mmol/L   Potassium 4.4 3.5 - 5.2 mmol/L   Chloride 102 96 - 106 mmol/L   CO2 22 20 - 29 mmol/L   Calcium 10.3 8.7 - 10.3 mg/dL   Total Protein 8.0 6.0 - 8.5 g/dL   Albumin 4.8 3.5 - 4.8 g/dL   Globulin, Total 3.2 1.5 - 4.5 g/dL   Albumin/Globulin Ratio 1.5 1.2 - 2.2   Bilirubin Total 0.6 0.0 - 1.2 mg/dL   Alkaline Phosphatase 32 (L) 39 - 117 IU/L   AST 73 (H) 0 - 40 IU/L   ALT 51 (H) 0 - 32 IU/L  Lipid Panel w/o Chol/HDL Ratio  Result Value Ref Range   Cholesterol, Total 177 100 - 199  mg/dL   Triglycerides 155 (H) 0 - 149 mg/dL   HDL 38 (L) >39 mg/dL   VLDL Cholesterol Cal 31 5 - 40 mg/dL   LDL Calculated 108 (H) 0 - 99 mg/dL  UA/M w/rflx Culture, Routine  Result Value Ref Range   Specific Gravity, UA 1.020 1.005 - 1.030   pH, UA 5.5 5.0 - 7.5   Color, UA Orange Yellow   Appearance Ur Cloudy (A) Clear   Leukocytes, UA 2+ (A) Negative   Protein, UA Negative Negative/Trace   Glucose, UA Negative Negative   Ketones, UA Negative Negative   RBC, UA Negative Negative   Bilirubin, UA Negative Negative   Urobilinogen, Ur 0.2 0.2 - 1.0 mg/dL   Nitrite, UA Negative Negative   Microscopic Examination See below:       Assessment & Plan:   Problem List Items Addressed This Visit      Cardiovascular and Mediastinum   Hypertension - Primary    Still running high normal, but under much better control. Continue current regimen, call with persistent abnormal readings at home. Continue DASH diet and exercise          Follow up plan: Return in about 6 months (around 03/19/2019) for BP f/u.

## 2018-09-20 NOTE — Patient Instructions (Signed)
Follow up in 6 months 

## 2018-09-20 NOTE — Assessment & Plan Note (Signed)
Still running high normal, but under much better control. Continue current regimen, call with persistent abnormal readings at home. Continue DASH diet and exercise

## 2018-09-25 ENCOUNTER — Encounter: Payer: Self-pay | Admitting: Family Medicine

## 2018-09-25 ENCOUNTER — Ambulatory Visit: Payer: Medicare PPO | Admitting: Family Medicine

## 2018-09-25 VITALS — BP 158/73 | HR 73 | Temp 98.2°F | Wt 177.6 lb

## 2018-09-25 DIAGNOSIS — R109 Unspecified abdominal pain: Secondary | ICD-10-CM | POA: Diagnosis not present

## 2018-09-25 DIAGNOSIS — N390 Urinary tract infection, site not specified: Secondary | ICD-10-CM

## 2018-09-25 DIAGNOSIS — R32 Unspecified urinary incontinence: Secondary | ICD-10-CM | POA: Diagnosis not present

## 2018-09-25 MED ORDER — PHENAZOPYRIDINE HCL 200 MG PO TABS: 200.0000 mg | ORAL_TABLET | Freq: Three times a day (TID) | ORAL | 0 refills | Status: DC | PRN

## 2018-09-25 MED ORDER — CIPROFLOXACIN HCL 500 MG PO TABS: 500.0000 mg | ORAL_TABLET | Freq: Two times a day (BID) | ORAL | 0 refills | Status: DC

## 2018-09-25 NOTE — Progress Notes (Signed)
BP (!) 158/73 (BP Location: Right Arm, Patient Position: Sitting, Cuff Size: Normal)   Pulse 73   Temp 98.2 F (36.8 C) (Oral)   Wt 177 lb 9.6 oz (80.6 kg)   SpO2 95%   BMI 25.48 kg/m    Subjective:    Patient ID: Brianna Curry, female    DOB: Mar 31, 1941, 77 y.o.   MRN: [id number]  HPI: Brianna Curry is a 77 y.o. female  Chief Complaint  Patient presents with  . Urinary Incontinence    Began this afternoon and patient couldn't stop.   . Bladder Pain  . Flank Pain   Here today with sudden onset urinary incontinence about 45 minutes ago with lower abdominal cramping and burning and now a bit of low back pain. Felt in her usual state of health prior to this episode. Now feels achy and fatigued. Denies fevers, chills, sweats, hematuria. Did have some loose stools episodes since a cookout this weekend prior to this. Has not tried anything for sxs.   Relevant past medical, surgical, family and social history reviewed and updated as indicated. Interim medical history since our last visit reviewed. Allergies and medications reviewed and updated.  Review of Systems  Per HPI unless specifically indicated above     Objective:    BP (!) 158/73 (BP Location: Right Arm, Patient Position: Sitting, Cuff Size: Normal)   Pulse 73   Temp 98.2 F (36.8 C) (Oral)   Wt 177 lb 9.6 oz (80.6 kg)   SpO2 95%   BMI 25.48 kg/m   Wt Readings from Last 3 Encounters:  09/25/18 177 lb 9.6 oz (80.6 kg)  09/18/18 175 lb 12.8 oz (79.7 kg)  08/14/18 174 lb 8 oz (79.2 kg)    Physical Exam  Constitutional: She is oriented to person, place, and time. She appears well-developed and well-nourished.  anxious  HENT:  Head: Atraumatic.  Eyes: Conjunctivae and EOM are normal.  Neck: Normal range of motion. Neck supple.  Cardiovascular: Normal rate and regular rhythm.  Pulmonary/Chest: Effort normal and breath sounds normal.  Musculoskeletal: Normal range of motion. She exhibits no tenderness (no CVA  tenderness b/l).  Neurological: She is alert and oriented to person, place, and time.  Skin: Skin is warm and dry.  Psychiatric: She has a normal mood and affect. Her behavior is normal.  Nursing note and vitals reviewed.   Results for orders placed or performed in visit on 08/14/18  Urine Culture  Result Value Ref Range   Urine Culture, Routine Final report (A)    Organism ID, Bacteria Comment (A)   Microscopic Examination  Result Value Ref Range   WBC, UA 6-10 (A) 0 - 5 /hpf   RBC, UA 0-2 0 - 2 /hpf   Epithelial Cells (non renal) 0-10 0 - 10 /hpf   Renal Epithel, UA 0-10 (A) None seen /hpf   Bacteria, UA Moderate (A) None seen/Few  CBC with Differential/Platelet  Result Value Ref Range   WBC 3.3 (L) 3.4 - 10.8 x10E3/uL   RBC 4.87 3.77 - 5.28 x10E6/uL   Hemoglobin 14.6 11.1 - 15.9 g/dL   Hematocrit 43.0 34.0 - 46.6 %   MCV 88 79 - 97 fL   MCH 30.0 26.6 - 33.0 pg   MCHC 34.0 31.5 - 35.7 g/dL   RDW 13.0 12.3 - 15.4 %   Platelets 236 150 - 450 x10E3/uL   Neutrophils 28 Not Estab. %   Lymphs 52 Not Estab. %  Monocytes 12 Not Estab. %   Eos 6 Not Estab. %   Basos 1 Not Estab. %   Neutrophils Absolute 0.9 (L) 1.4 - 7.0 x10E3/uL   Lymphocytes Absolute 1.7 0.7 - 3.1 x10E3/uL   Monocytes Absolute 0.4 0.1 - 0.9 x10E3/uL   EOS (ABSOLUTE) 0.2 0.0 - 0.4 x10E3/uL   Basophils Absolute 0.0 0.0 - 0.2 x10E3/uL   Immature Granulocytes 1 Not Estab. %   Immature Grans (Abs) 0.0 0.0 - 0.1 x10E3/uL   Hematology Comments: Note:   Comprehensive metabolic panel  Result Value Ref Range   Glucose 107 (H) 65 - 99 mg/dL   BUN 15 8 - 27 mg/dL   Creatinine, Ser 0.92 0.57 - 1.00 mg/dL   GFR calc non Af Amer 60 >59 mL/min/1.73   GFR calc Af Amer 69 >59 mL/min/1.73   BUN/Creatinine Ratio 16 12 - 28   Sodium 142 134 - 144 mmol/L   Potassium 4.4 3.5 - 5.2 mmol/L   Chloride 102 96 - 106 mmol/L   CO2 22 20 - 29 mmol/L   Calcium 10.3 8.7 - 10.3 mg/dL   Total Protein 8.0 6.0 - 8.5 g/dL   Albumin  4.8 3.5 - 4.8 g/dL   Globulin, Total 3.2 1.5 - 4.5 g/dL   Albumin/Globulin Ratio 1.5 1.2 - 2.2   Bilirubin Total 0.6 0.0 - 1.2 mg/dL   Alkaline Phosphatase 32 (L) 39 - 117 IU/L   AST 73 (H) 0 - 40 IU/L   ALT 51 (H) 0 - 32 IU/L  Lipid Panel w/o Chol/HDL Ratio  Result Value Ref Range   Cholesterol, Total 177 100 - 199 mg/dL   Triglycerides 155 (H) 0 - 149 mg/dL   HDL 38 (L) >39 mg/dL   VLDL Cholesterol Cal 31 5 - 40 mg/dL   LDL Calculated 108 (H) 0 - 99 mg/dL  UA/M w/rflx Culture, Routine  Result Value Ref Range   Specific Gravity, UA 1.020 1.005 - 1.030   pH, UA 5.5 5.0 - 7.5   Color, UA Orange Yellow   Appearance Ur Cloudy (A) Clear   Leukocytes, UA 2+ (A) Negative   Protein, UA Negative Negative/Trace   Glucose, UA Negative Negative   Ketones, UA Negative Negative   RBC, UA Negative Negative   Bilirubin, UA Negative Negative   Urobilinogen, Ur 0.2 0.2 - 1.0 mg/dL   Nitrite, UA Negative Negative   Microscopic Examination See below:       Assessment & Plan:   Problem List Items Addressed This Visit    None    Visit Diagnoses    Lower urinary tract infection    -  Primary   Will tx with cipro and pyridium. Await cx. Make sure cleaning well after BMs and wiping appropriately   Relevant Medications   phenazopyridine (PYRIDIUM) 200 MG tablet   Other Relevant Orders   UA/M w/rflx Culture, Routine   Urine Culture   Flank pain       Relevant Orders   UA/M w/rflx Culture, Routine       Follow up plan: Return for as scheduled.

## 2018-09-25 NOTE — Patient Instructions (Addendum)
Follow up as scheduled.  

## 2018-09-26 LAB — MICROSCOPIC EXAMINATION

## 2018-09-26 LAB — UA/M W/RFLX CULTURE, ROUTINE
BILIRUBIN UA: NEGATIVE
GLUCOSE, UA: NEGATIVE
KETONES UA: NEGATIVE
Nitrite, UA: NEGATIVE
PROTEIN UA: NEGATIVE
SPEC GRAV UA: 1.015 (ref 1.005–1.030)
Urobilinogen, Ur: 0.2 mg/dL (ref 0.2–1.0)
pH, UA: 7 (ref 5.0–7.5)

## 2018-09-27 LAB — URINE CULTURE

## 2018-10-09 ENCOUNTER — Telehealth: Payer: Self-pay | Admitting: Family Medicine

## 2018-10-09 DIAGNOSIS — N390 Urinary tract infection, site not specified: Secondary | ICD-10-CM

## 2018-10-09 NOTE — Telephone Encounter (Signed)
Copied from [place] [id number]. Topic: Referral - Request for Referral >> Oct 09, 2018  3:56 PM Marin Olp L wrote: Has patient seen PCP for this complaint? Yes.   *If NO, is insurance requiring patient see PCP for this issue before PCP can refer them? Referral for which specialty:urology Preferred provider/office: [facility] Dr. Hollice Espy Reason for referral: Bladder infections  Please call once placed. >> Oct 09, 2018  4:22 PM Stark Klein wrote: Pt would like a referral for urology.    Referral done please notify Patient

## 2018-10-10 ENCOUNTER — Telehealth: Payer: Self-pay

## 2018-10-10 NOTE — Telephone Encounter (Signed)
Message left for patient

## 2018-10-10 NOTE — Telephone Encounter (Signed)
-----   Message from Guadalupe Maple, MD sent at 10/09/2018  4:57 PM EDT ----- Regarding: apt Copied from [place] [phone]. Topic: Referral - Request for Referral >> Oct 09, 2018  3:56 PM Marin Olp L wrote: Has patient seen PCP for this complaint? Yes.   #If NO, is insurance requiring patient see PCP for this issue before PCP can refer them? Referral for which specialty:urology Preferred provider/office: [facility] Dr. Hollice Espy Reason for referral: Bladder infections  Please call once placed. >> Oct 09, 2018  4:22 PM Stark Klein wrote: Pt would like a referral for urology.    Referral done please notify Patient

## 2018-10-26 ENCOUNTER — Ambulatory Visit (INDEPENDENT_AMBULATORY_CARE_PROVIDER_SITE_OTHER): Payer: Medicare PPO | Admitting: Urology

## 2018-10-26 ENCOUNTER — Encounter: Payer: Self-pay | Admitting: Urology

## 2018-10-26 VITALS — BP 167/84 | HR 65 | Ht 70.0 in | Wt 177.6 lb

## 2018-10-26 DIAGNOSIS — N952 Postmenopausal atrophic vaginitis: Secondary | ICD-10-CM

## 2018-10-26 DIAGNOSIS — R310 Gross hematuria: Secondary | ICD-10-CM

## 2018-10-26 LAB — URINALYSIS, COMPLETE
Bilirubin, UA: NEGATIVE
GLUCOSE, UA: NEGATIVE
Ketones, UA: NEGATIVE
LEUKOCYTES UA: NEGATIVE
Nitrite, UA: NEGATIVE
PH UA: 7 (ref 5.0–7.5)
PROTEIN UA: NEGATIVE
RBC, UA: NEGATIVE
Specific Gravity, UA: 1.02 (ref 1.005–1.030)
UUROB: 0.2 mg/dL (ref 0.2–1.0)

## 2018-10-26 LAB — MICROSCOPIC EXAMINATION: RBC, UA: NONE SEEN /hpf (ref 0–2)

## 2018-10-26 LAB — BLADDER SCAN AMB NON-IMAGING: Scan Result: 0

## 2018-10-26 NOTE — Progress Notes (Signed)
10/26/2018 12:25 PM   Brianna Curry 1941/02/01 [id number]  Referring provider: Guadalupe Maple, MD [address] [place], [place] [address]  Chief Complaint  Patient presents with  . Recurrent UTI    HPI: Patient is a 77 -year-old Caucasian female who is referred to [place] by Volney American, PA-C  for recurrent urinary tract infections, with sister [name].    Patient states that she has had five urinary tract infections over the last year.  Reviewing her records,  she has had 2+ urine cultures documented over the last year. On August 14, 2018+ for beta-hemolytic Streptococcus, group B On January 03, 2018+ for E. coli  Her symptoms with a urinary tract infection consist of gushes of brown gunk, gross hematuria, suprapubic pain and LBP.  Antibiotics make no difference.    She does not have a history of nephrolithiasis, GU surgery or GU trauma.   She is not sexually active.  She is postmenopausal.  She admits to constipation.    She does engage in good perineal hygiene. She has stopped taking bathing recently and now takes showers.    She has incontinence.  She is using incontinence pads and wears one during the day and one during the night.    She drinks some water.  No coffee.  Drinks sweet tea.  Drinks diet caffeine sodas.  Drinks cranberry juice daily.  No energy drinks.  No alcohol.    Today, she has baseline nocturia and incontinence.  She is having the suprapubic pain, burning that constant and worsens after a void.   Patient denies any fevers, chills, nausea or vomiting.   Her UA is negative.  Her PVR 0 mL.    PMH: Past Medical History:  Diagnosis Date  . Anxiety   . GERD (gastroesophageal reflux disease)   . Glaucoma   . Hyperlipidemia   . Hypertension   . IBS (irritable bowel syndrome)     Surgical History: Past Surgical History:  Procedure Laterality Date  . COLONOSCOPY    . NASAL SINUS SURGERY  1976    Home Medications:  Allergies as of  10/26/2018      Reactions   Sulfa Antibiotics    Zithromax [azithromycin]    Penicillins Rash      Medication List        Accurate as of 10/26/18 12:25 PM. Always use your most recent med list.          albuterol 108 (90 Base) MCG/ACT inhaler Commonly known as:  PROVENTIL HFA;VENTOLIN HFA Inhale 2 puffs into the lungs every 4 (four) hours as needed for wheezing or shortness of breath.   aspirin EC 81 MG tablet Take 81 mg by mouth daily.   bisoprolol-hydrochlorothiazide 10-6.25 MG tablet Commonly known as:  ZIAC Take 1 tablet by mouth 2 (two) times daily.   fenofibrate 160 MG tablet Take 1 tablet (160 mg total) by mouth daily.   latanoprost 0.005 % ophthalmic solution Commonly known as:  XALATAN   multivitamin tablet Take 1 tablet by mouth daily.   omeprazole 40 MG capsule Commonly known as:  PRILOSEC Take 1 capsule (40 mg total) by mouth daily.   phenazopyridine 200 MG tablet Commonly known as:  PYRIDIUM Take 1 tablet (200 mg total) by mouth 3 (three) times daily as needed for pain.   pravastatin 40 MG tablet Commonly known as:  PRAVACHOL TAKE 1 TABLET (40 MG TOTAL) BY MOUTH DAILY   STOOL SOFTENER PO Take by mouth  daily.       Allergies:  Allergies  Allergen Reactions  . Sulfa Antibiotics   . Zithromax [Azithromycin]   . Penicillins Rash    Family History: Family History  Problem Relation Age of Onset  . Hypertension Mother   . Heart attack Mother   . Hypertension Father   . Heart attack Father   . Glaucoma Maternal Grandmother   . Heart attack Maternal Grandmother   . Heart disease Maternal Grandmother   . Hypertension Maternal Grandmother   . Stroke Paternal Grandmother   . Stroke Paternal Grandfather     Social History:  reports that she has never smoked. She has never used smokeless tobacco. She reports that she does not drink alcohol or use drugs.  ROS: UROLOGY Frequent Urination?: No Hard to postpone urination?: No Burning/pain  with urination?: Yes Get up at night to urinate?: Yes Leakage of urine?: Yes Urine stream starts and stops?: No Trouble starting stream?: No Do you have to strain to urinate?: No Blood in urine?: Yes Urinary tract infection?: Yes Sexually transmitted disease?: No Injury to kidneys or bladder?: No Painful intercourse?: No Weak stream?: No Currently pregnant?: No Vaginal bleeding?: No Last menstrual period?: n  Gastrointestinal Nausea?: No Vomiting?: No Indigestion/heartburn?: No Diarrhea?: No Constipation?: Yes  Constitutional Fever: No Night sweats?: No Weight loss?: No  Skin Skin rash/lesions?: No Itching?: No  Eyes Blurred vision?: No Double vision?: No  Ears/Nose/Throat Sore throat?: No Sinus problems?: No  Hematologic/Lymphatic Swollen glands?: No Easy bruising?: No  Cardiovascular Leg swelling?: No Chest pain?: No  Respiratory Cough?: No Shortness of breath?: No  Endocrine Excessive thirst?: No  Musculoskeletal Back pain?: Yes Joint pain?: Yes  Neurological Headaches?: No Dizziness?: No  Psychologic Depression?: No Anxiety?: No  Physical Exam: BP (!) 167/84 (BP Location: Left Arm, Patient Position: Sitting, Cuff Size: Normal)   Pulse 65   Ht 5\' 10"  (1.778 m)   Wt 177 lb 9.6 oz (80.6 kg)   BMI 25.48 kg/m   Constitutional:  Well nourished. Alert and oriented, No acute distress. HEENT: [place] AT, moist mucus membranes.  Trachea midline, no masses. Cardiovascular: No clubbing, cyanosis, or edema. Respiratory: Normal respiratory effort, no increased work of breathing. GI: Abdomen is soft, non tender, non distended, no abdominal masses. Liver and spleen not palpable.  No hernias appreciated.  Stool sample for occult testing is not indicated.   GU: No CVA tenderness.  No bladder fullness or masses.  Atrophic external genitalia, normal pubic hair distribution, no lesions.  Normal urethral meatus, no lesions, no prolapse, no discharge.   No  urethral masses, tenderness and/or tenderness. No bladder fullness, tenderness or masses. Pale vagina mucosa, poor estrogen effect, no discharge, no lesions, poor pelvic support, no cystocele or rectocele noted.  No cervical motion tenderness.  Uterus is fixed and enlarged.  No adnexal/parametria masses or tenderness noted.  Anus and perineum are without rashes or lesions.   Skin: No rashes, bruises or suspicious lesions. Lymph: No cervical or inguinal adenopathy. Neurologic: Grossly intact, no focal deficits, moving all 4 extremities. Psychiatric: Normal mood and affect.  Laboratory Data: Lab Results  Component Value Date   WBC 3.3 (L) 08/14/2018   HGB 14.6 08/14/2018   HCT 43.0 08/14/2018   MCV 88 08/14/2018   PLT 236 08/14/2018    Lab Results  Component Value Date   CREATININE 0.92 08/14/2018    No results found for: PSA  No results found for: TESTOSTERONE  No results found for:  HGBA1C  Lab Results  Component Value Date   TSH 2.230 05/17/2016       Component Value Date/Time   CHOL 177 08/14/2018 1106   CHOL 181 10/01/2015 0936   HDL 38 (L) 08/14/2018 1106   VLDL 27 10/01/2015 0936   LDLCALC 108 (H) 08/14/2018 1106    Lab Results  Component Value Date   AST 73 (H) 08/14/2018   Lab Results  Component Value Date   ALT 51 (H) 08/14/2018   No components found for: ALKALINEPHOPHATASE No components found for: BILIRUBINTOTAL  No results found for: ESTRADIOL  Urinalysis Bland.  See Epic.  I have reviewed the labs.   Pertinent Imaging: Results for PASCALE, MAVES (MRN [id number]) as of 10/26/2018 12:05  Ref. Range 10/26/2018 11:19  Scan Result Unknown 0    Assessment & Plan:    1. Gross hematuria I explained to the patient that there are a number of causes that can be associated with blood in the urine, such as stones,  UTI's, damage to the urinary tract and/or cancer. At this time, I felt that the patient warranted further urologic evaluation with 3 or  greater RBC's/hpf on microscopic evaluation of the urine.  The AUA guidelines state that a CT urogram is the preferred imaging study to evaluate hematuria. I explained to the patient that a contrast material will be injected into a vein and that in rare instances, an allergic reaction can result and may even life threatening   The patient denies any allergies to contrast, iodine and/or seafood and is not taking metformin Following the imaging study,  I've recommended a cystoscopy. I described how this is performed, typically in an office setting with a flexible cystoscope. We described the risks, benefits, and possible side effects, the most common of which is a minor amount of blood in the urine and/or burning which usually resolves in 24 to 48 hours.   The patient had the opportunity to ask questions which were answered. Based upon this discussion, the patient is willing to proceed. Therefore, I've ordered: a CT Urogram and cystoscopy. The patient will return following all of the above for discussion of the results.  UA Urine culture BUN + creatinine    2. Suprapubic pain CTU and cysto pending  3. ? Uterine fibroid vs malignancy  CTU pending                                Return for CT Urogram report and cystoscopy with Dr. Erlene Quan as her sister is a patient of Dr. Erlene Quan .  These notes generated with voice recognition software. I apologize for typographical errors.  Brianna Council, PA-C  [facility] [address]  [place], [place] [address] [phone]

## 2018-11-03 ENCOUNTER — Ambulatory Visit
Admission: RE | Admit: 2018-11-03 | Discharge: 2018-11-03 | Disposition: A | Discharge status: Home / Self Care | Payer: Medicare PPO | Source: Ambulatory Visit | Attending: Urology | Admitting: Urology

## 2018-11-03 DIAGNOSIS — I080 Rheumatic disorders of both mitral and aortic valves: Secondary | ICD-10-CM | POA: Diagnosis not present

## 2018-11-03 DIAGNOSIS — R160 Hepatomegaly, not elsewhere classified: Secondary | ICD-10-CM | POA: Diagnosis not present

## 2018-11-03 DIAGNOSIS — I700 Atherosclerosis of aorta: Secondary | ICD-10-CM | POA: Diagnosis not present

## 2018-11-03 DIAGNOSIS — K7689 Other specified diseases of liver: Secondary | ICD-10-CM | POA: Diagnosis not present

## 2018-11-03 DIAGNOSIS — R310 Gross hematuria: Secondary | ICD-10-CM | POA: Diagnosis not present

## 2018-11-03 LAB — POCT I-STAT CREATININE: Creatinine, Ser: 0.8 mg/dL (ref 0.44–1.00)

## 2018-11-03 MED ORDER — IOPAMIDOL (ISOVUE-300) INJECTION 61%
125.0000 mL | Freq: Once | INTRAVENOUS | Status: AC | PRN
  Administered 2018-11-03: 125 mL via INTRAVENOUS

## 2018-11-14 NOTE — Progress Notes (Addendum)
   11/15/18  CC:  Chief Complaint  Patient presents with  . Procedure    Cystoscopy    HPI: Brianna Curry  returns today for a cystoscopy for management and evaluation of rUTIs. The patient's last visit with [place] was on 10/26/2018. She is accompanied today by a family member. Pt reports feeling well overall and reports of lower back pain.   CT urogram showed no urology, incomplete classification of the right distal ureter. No pathology identified, and incidental liver lesion appreciated.   Pt has a personal history of 5 x UTIs over the last year. She has had 2+ urine cultures documented over the last year. On 08/14/18 for beta-hemolytic Streptococcus, group B  On 01/03/18 for E.coli.   She does not have history of nephrolithiasis, GU surgery or GU trauma.    Blood pressure (!) 159/78, pulse 74, weight 178 lb (80.7 kg). NED. A&Ox3.   No respiratory distress   Abd soft, NT, ND Normal external genitalia with patent urethral meatus  Cystoscopy Procedure Note  Patient identification was confirmed, informed consent was obtained, and patient was prepped using Betadine solution.  Lidocaine jelly was administered per urethral meatus.    Procedure: - Flexible cystoscope introduced, without any difficulty.   - Thorough search of the bladder revealed:    normal urethral meatus    normal urothelium    no stones    no ulcers     no tumors    no urethral polyps    no trabeculation - Ureteral orifices were normal in position and appearance.  Normal cystoscopy except for Mild trigonitis.  Post-Procedure: - Patient tolerated the procedure well  Assessment/ Plan: 1. Liver mass  Order liver MRI Hemangioma, however, if benign then it r/o other pathology.    2. rUTI Follow-up with Larene Beach in 4 week to discuss rUTIs.   3. Gross hematuria S/p markable cystoscopy today CT urogram reviewed, no obvious GU pathology although right distal ureter incompletely opacified, low risk for upper  tract urothelial cancer thus would not recommend any further imaging   Return in about 4 weeks (around 12/13/2018) for  discuss rUTIs with [facility] .  Hollice Espy, MD  I, Lucas Mallow, am acting as a scribe for Dr. Hollice Espy,  I have reviewed the above documentation for accuracy and completeness, and I agree with the above.   Hollice Espy, MD

## 2018-11-15 ENCOUNTER — Other Ambulatory Visit: Payer: Self-pay

## 2018-11-15 ENCOUNTER — Ambulatory Visit (INDEPENDENT_AMBULATORY_CARE_PROVIDER_SITE_OTHER): Payer: Medicare PPO | Admitting: Urology

## 2018-11-15 ENCOUNTER — Encounter: Payer: Self-pay | Admitting: Urology

## 2018-11-15 VITALS — BP 159/78 | HR 74 | Wt 178.0 lb

## 2018-11-15 DIAGNOSIS — Z01812 Encounter for preprocedural laboratory examination: Secondary | ICD-10-CM

## 2018-11-15 DIAGNOSIS — K769 Liver disease, unspecified: Secondary | ICD-10-CM

## 2018-11-15 DIAGNOSIS — R310 Gross hematuria: Secondary | ICD-10-CM | POA: Diagnosis not present

## 2018-11-15 LAB — MICROSCOPIC EXAMINATION: RBC, UA: NONE SEEN /hpf (ref 0–2)

## 2018-11-15 LAB — URINALYSIS, COMPLETE
Bilirubin, UA: NEGATIVE
Glucose, UA: NEGATIVE
Ketones, UA: NEGATIVE
Nitrite, UA: NEGATIVE
Protein, UA: NEGATIVE
RBC UA: NEGATIVE
Specific Gravity, UA: 1.02 (ref 1.005–1.030)
Urobilinogen, Ur: 1 mg/dL (ref 0.2–1.0)
pH, UA: 7.5 (ref 5.0–7.5)

## 2018-12-11 ENCOUNTER — Ambulatory Visit
Admission: RE | Admit: 2018-12-11 | Discharge: 2018-12-11 | Disposition: A | Discharge status: Home / Self Care | Payer: Medicare PPO | Source: Ambulatory Visit | Attending: Urology | Admitting: Urology

## 2018-12-11 DIAGNOSIS — K7689 Other specified diseases of liver: Secondary | ICD-10-CM | POA: Diagnosis not present

## 2018-12-11 DIAGNOSIS — K769 Liver disease, unspecified: Secondary | ICD-10-CM | POA: Diagnosis not present

## 2018-12-11 MED ORDER — GADOBUTROL 1 MMOL/ML IV SOLN
8.0000 mL | Freq: Once | INTRAVENOUS | Status: AC | PRN
  Administered 2018-12-11: 8 mL via INTRAVENOUS

## 2018-12-13 ENCOUNTER — Telehealth: Payer: Self-pay

## 2018-12-13 ENCOUNTER — Ambulatory Visit: Payer: Medicare PPO | Admitting: Urology

## 2018-12-13 ENCOUNTER — Encounter: Payer: Self-pay | Admitting: Urology

## 2018-12-13 VITALS — BP 145/70 | HR 68 | Ht 70.0 in | Wt 178.3 lb

## 2018-12-13 DIAGNOSIS — Z87440 Personal history of urinary (tract) infections: Secondary | ICD-10-CM | POA: Diagnosis not present

## 2018-12-13 DIAGNOSIS — I2510 Atherosclerotic heart disease of native coronary artery without angina pectoris: Secondary | ICD-10-CM

## 2018-12-13 DIAGNOSIS — Z87448 Personal history of other diseases of urinary system: Secondary | ICD-10-CM

## 2018-12-13 DIAGNOSIS — R160 Hepatomegaly, not elsewhere classified: Secondary | ICD-10-CM

## 2018-12-13 NOTE — Telephone Encounter (Signed)
Received call and referral from [facility] for new diagnosis liver cancer. Appointment has been arranged with Dr. Tasia Catchings for 12/19 in the [facility] clinic at 0830. She is currently in the clinic at [facility] and they will notify her with the  appointment details. Oncology Nurse Navigator Documentation  Navigator Location: [facility] (12/13/18 1500)   )Navigator Encounter Type: Telephone (12/13/18 1500) Telephone: Incoming Call;Outgoing Call;Appt Confirmation/Clarification (12/13/18 1500)                                                  Time Spent with Patient: 15 (12/13/18 1500)

## 2018-12-13 NOTE — Progress Notes (Signed)
12/13/2018 8:33 AM   Brianna Curry Nov 12, 1941 [id number]  Referring provider: Guadalupe Maple, MD [address] [place], [place] [address]  Chief Complaint  Patient presents with  . Follow-up  . Vaginal Atrophy    HPI: Brianna Curry  is a 77 -year-old Caucasian female who presents today for the evaluation and management a liver mass. She is accompanied today with his sister, Brianna Curry.  Her last visit with [place] was on 11/15/2018 for a cystoscopy. She reports of doing well overall. Her MR liver showed 7.7 x 7.5 cm right hepatic lobe lesion.  Background history:  She was referred to [place] by Volney American, PA-C  for recurrent urinary tract infections, with sister Brianna Curry.   Patient stated that she has had five urinary tract infections over the last year. Reviewing her records,  she has had 2+ urine cultures documented over the last year. On August 14, 2018+ for beta-hemolytic Streptococcus, group B On January 03, 2018+ for E. coli Her symptoms with a urinary tract infection consist of gushes of brown gunk, gross hematuria, suprapubic pain and LBP.  Antibiotics make no difference. She does not have a history of nephrolithiasis, GU surgery or GU trauma. She is not sexually active.  She is postmenopausal. She admits to constipation.   She does engage in good perineal hygiene. She has stopped taking bathing recently and now takes showers. She has incontinence.  She is using incontinence pads and wears one during the day and one during the night. She drinks some water.  No coffee.  Drinks sweet tea.  Drinks diet caffeine sodas.  Drinks cranberry juice daily.  No energy drinks.  No alcohol.   On 10/26/2018, she has baseline nocturia and incontinence.  She is having the suprapubic pain, burning that constant and worsens after a void.   Patient denies any fevers, chills, nausea or vomiting.   Her UA was negative.  Her PVR was 0 mL.    She complained of gross hematuria at her visit on 10/26/2018.    She completed a hematuria work up with CTU and cystoscopy.  CTU on 11/03/2018 revealed subcentimeter low-attenuation lesions in both kidneys, too small to characterize, but statistically likely to represent cysts. No larger suspicious appearing renal lesions. No hydroureteronephrosis. Urinary bladder is normal in appearance.  Bilateral adrenal glands are normal in appearance.  Large complex mass centered in the right lobe of the liver. This does have imaging characteristics that may suggest a cavernous hemangioma, however, this is not certain on today's CT examination.  Further characterization with nonemergent MRI of the abdomen with and without IV gadolinium is strongly recommended in the near future to better characterize this finding, as neoplasm is not excluded.  There are calcifications of the aortic valve and mitral annulus.  Echocardiographic correlation for evaluation of potential valvular dysfunction may be warranted if clinically indicated.  Cystoscopy on 11/15/2018 with Dr. Erlene Quan was negative.    MRI on 12/11/2018 revealed 7.7 x 7.5 cm right hepatic lobe lesion worrisome for malignancy. Although this could be a metastasis I do not see any obvious primary. I would favor a primary hepatic neoplasm such as [name].  Recommend tissue biopsy.  She states she has never drank alcohol or smoked.  Her nephew has liver cancer, but he is a heavy drinker.   PMH: Past Medical History:  Diagnosis Date  . Anxiety   . CAD (coronary artery disease)   . Cancer ([place])    melanomia  .  GERD (gastroesophageal reflux disease)   . Glaucoma   . Hyperlipidemia   . Hypertension   . IBS (irritable bowel syndrome)     Surgical History: Past Surgical History:  Procedure Laterality Date  . COLONOSCOPY    . NASAL SINUS SURGERY  1976    Home Medications:  Allergies as of 12/13/2018      Reactions   Sulfa Antibiotics    Zithromax [azithromycin]    Penicillins Rash      Medication List       Accurate  as of December 13, 2018 11:59 PM. Always use your most recent med list.        albuterol 108 (90 Base) MCG/ACT inhaler Commonly known as:  PROVENTIL HFA;VENTOLIN HFA Inhale 2 puffs into the lungs every 4 (four) hours as needed for wheezing or shortness of breath.   aspirin EC 81 MG tablet Take 81 mg by mouth daily.   bisoprolol-hydrochlorothiazide 10-6.25 MG tablet Commonly known as:  ZIAC Take 1 tablet by mouth 2 (two) times daily.   fenofibrate 160 MG tablet Take 1 tablet (160 mg total) by mouth daily.   latanoprost 0.005 % ophthalmic solution Commonly known as:  XALATAN   multivitamin tablet Take 1 tablet by mouth daily.   pravastatin 40 MG tablet Commonly known as:  PRAVACHOL TAKE 1 TABLET (40 MG TOTAL) BY MOUTH DAILY   STOOL SOFTENER PO Take by mouth daily.       Allergies:  Allergies  Allergen Reactions  . Sulfa Antibiotics   . Zithromax [Azithromycin]   . Penicillins Rash    Family History: Family History  Problem Relation Age of Onset  . Hypertension Mother   . Heart attack Mother   . Hypertension Father   . Heart attack Father   . Prostate cancer Father   . Glaucoma Maternal Grandmother   . Heart attack Maternal Grandmother   . Heart disease Maternal Grandmother   . Hypertension Maternal Grandmother   . Stroke Paternal Grandmother   . Stroke Paternal Grandfather     Social History:  reports that she has never smoked. She has never used smokeless tobacco. She reports that she does not drink alcohol or use drugs.  ROS: UROLOGY Frequent Urination?: No Hard to postpone urination?: No Burning/pain with urination?: No Get up at night to urinate?: Yes Leakage of urine?: No Urine stream starts and stops?: No Trouble starting stream?: No Do you have to strain to urinate?: No Blood in urine?: No Urinary tract infection?: No Sexually transmitted disease?: No Injury to kidneys or bladder?: No Painful intercourse?: No Weak stream?: No Currently  pregnant?: No Vaginal bleeding?: No Last menstrual period?: n  Gastrointestinal Nausea?: No Vomiting?: No Indigestion/heartburn?: No Diarrhea?: No Constipation?: No  Constitutional Fever: No Night sweats?: No Weight loss?: No Fatigue?: No  Skin Skin rash/lesions?: No Itching?: No  Eyes Blurred vision?: No Double vision?: No  Ears/Nose/Throat Sore throat?: No Sinus problems?: No  Hematologic/Lymphatic Swollen glands?: No Easy bruising?: No  Cardiovascular Leg swelling?: No Chest pain?: No  Respiratory Cough?: No Shortness of breath?: No  Endocrine Excessive thirst?: No  Musculoskeletal Back pain?: No Joint pain?: No  Neurological Headaches?: No Dizziness?: No  Psychologic Depression?: No Anxiety?: No  Physical Exam: BP (!) 145/70 (BP Location: Left Arm, Patient Position: Sitting, Cuff Size: Normal)   Pulse 68   Ht 5\' 10"  (1.778 m)   Wt 178 lb 4.8 oz (80.9 kg)   BMI 25.58 kg/m   Constitutional:  Well nourished.  Alert and oriented, No acute distress. HEENT: [place] AT, moist mucus membranes.  Trachea midline, no masses. Cardiovascular: No clubbing, cyanosis, or edema. Respiratory: Normal respiratory effort, no increased work of breathing. Skin: No rashes, bruises or suspicious lesions. Neurologic: Grossly intact, no focal deficits, moving all 4 extremities. Psychiatric: Normal mood and affect.   Pertinent Imaging: CLINICAL DATA:  Evaluate liver lesion seen on recent CT scan.  EXAM: MRI ABDOMEN WITHOUT AND WITH CONTRAST  TECHNIQUE: Multiplanar multisequence MR imaging of the abdomen was performed both before and after the administration of intravenous contrast.  CONTRAST:  8 cc Gadavist  COMPARISON:  CT scan 11/13/2018  FINDINGS: Lower chest: The lung bases are grossly clear. No pulmonary lesions. No pleural or pericardial effusion. Significant pectus deformity.  Hepatobiliary: As demonstrated on the CT scan there is a large  right hepatic lobe lesion occupying segment 7 and measuring approximately 7.7 x 7.5 cm. It has heterogeneous increased T2 signal intensity and areas of increased T1 signal intensity which could suggest hemorrhage. Following contrast administration there is very heterogeneous contrast enhancement in a large central scar or necrosis. Is also an enhancing rim around the lesion. This is very worrisome for malignancy. In retrospect I do not see an on prior ultrasounds from 2012 or 2007. It could be a metastatic focus although I do not see any findings in the abdomen or pelvis to suggest this. There is no adenopathy. [place] is a strong possibility and recommend biopsy.  No other hepatic lesions. No intra or extrahepatic biliary dilatation. The portal and hepatic veins are patent. The gallbladder appears normal.  Pancreas:  No mass, inflammation or ductal dilatation.  Spleen:  Normal size.  No focal lesions.  Adrenals/Urinary Tract:  The adrenal glands and kidneys are normal.  Stomach/Bowel: Visualized portions within the abdomen are unremarkable.  Vascular/Lymphatic: No pathologically enlarged lymph nodes identified. No abdominal aortic aneurysm demonstrated.  Other:  No ascites or abdominal wall hernia.  Musculoskeletal: No significant bony findings.  IMPRESSION: 1. 7.7 x 7.5 cm right hepatic lobe lesion worrisome for malignancy. Although this could be a metastasis I do not see any obvious primary. I would favor a primary hepatic neoplasm such as [place]. Recommend tissue biopsy. 2. No other significant abdominal findings. No adenopathy or acute abdominal process.   Electronically Signed   By: Marijo Sanes M.D.   On: 12/11/2018 16:27  CLINICAL DATA:  77 year old female his history of right lower quadrant abdominal pain and lower abdominal pain since September 2019. Gross hematuria.  EXAM: CT ABDOMEN AND PELVIS WITHOUT AND WITH  CONTRAST  TECHNIQUE: Multidetector CT imaging of the abdomen and pelvis was performed following the standard protocol before and following the bolus administration of intravenous contrast.  CONTRAST:  150mL ISOVUE-300 IOPAMIDOL (ISOVUE-300) INJECTION 61%  COMPARISON:  None.  FINDINGS: Lower chest: Aortic atherosclerosis. Atherosclerotic calcifications in the left main, left anterior descending and right coronary arteries. Calcifications of the aortic valve. Calcifications of the mitral annulus.  Hepatobiliary: In the right lobe of the liver there is a large mass centered predominantly in segment 7 (axial image 20 of series 9 and coronal image 122 of series 12) measuring 5.6 x 7.5 x 7.7 cm, which demonstrates heterogeneous attenuation on precontrast images, with heterogeneous enhancement on post gadolinium images, which is most evident peripherally, with some mild progressive centripetal filling on delayed images. This lesion appears to have a capsule on postcontrast delayed images. No other hepatic lesions. No intra or extrahepatic biliary ductal dilatation. Gallbladder is  normal in appearance.  Pancreas: No pancreatic mass. No pancreatic ductal dilatation. No pancreatic or peripancreatic fluid or inflammatory changes.  Spleen: Unremarkable.  Adrenals/Urinary Tract: Subcentimeter low-attenuation lesions in both kidneys, too small to characterize, but statistically likely to represent cysts. No larger suspicious appearing renal lesions. No hydroureteronephrosis. Urinary bladder is normal in appearance. Bilateral adrenal glands are normal in appearance.  Stomach/Bowel: Normal appearance of the stomach. No pathologic dilatation of small bowel or colon. Normal appendix.  Vascular/Lymphatic: Aortic atherosclerosis, without evidence of aneurysm or dissection in the abdominal or pelvic vasculature. No lymphadenopathy noted in the abdomen or pelvis.  Reproductive:  Uterus and ovaries are atrophic.  Other: No significant volume of ascites.  No pneumoperitoneum.  Musculoskeletal: There are no aggressive appearing lytic or blastic lesions noted in the visualized portions of the skeleton.  IMPRESSION: 1. Large complex mass centered in the right lobe of the liver. This does have imaging characteristics that may suggest a cavernous hemangioma, however, this is not certain on today's CT examination. Further characterization with nonemergent MRI of the abdomen with and without IV gadolinium is strongly recommended in the near future to better characterize this finding, as neoplasm is not excluded. 2. No acute findings are noted in the abdomen or pelvis to account for the patient's symptoms. 3. No findings to account for the patient's history of hematuria. 4. Aortic atherosclerosis. 5. There are calcifications of the aortic valve and mitral annulus. Echocardiographic correlation for evaluation of potential valvular dysfunction may be warranted if clinically indicated.  Electronically Signed   By: Vinnie Langton M.D.   On: 11/03/2018 17:30  I have personally reviewed both scans and note the liver mass and calcification of the valves.   Assessment & Plan:    1. Liver mass  MR Liver showed 7.7 x 7.5 cm right hepatic lobe lesion worrisome for [facility]   Referral to [facility], oncology in [place], [place] - appointment tomorrow  2. Calcification of heart valves CT showed There are calcifications of the aortic valve and mitral annulus.  Echocardiographic correlation for evaluation of potential valvular dysfunction may be warranted if clinically indicated. Her sister would like a referral to Dr. Ubaldo Glassing for further evaluation and management - referral placed   3. History of hematuria Hematuria work up completed in 11/15/2018  - findings positive for liver mass No report of gross hematuria  RTC in one year for UA (10/2019) - patient to report any  gross hematuria in the interim    4. rUTI    Discuss rUTI after oncology visit   Return for pending cancer center and cardiologist plans .  These notes generated with voice recognition software. I apologize for typographical errors.  Zara Council, PA-C  [facility] [address]  [place], [place] [address] [phone]  I, Lucas Mallow, am acting as a Education administrator for [employer],  I have reviewed the above documentation for accuracy and completeness, and I agree with the above.    Zara Council, PA-C  I spent 30 minutes with this patient and her sister in a face to face visit of which greater than 50% was spent in counseling and coordination of care with the patient regarding the next possible steps in evaluated her liver mass and referral to cardiology.

## 2018-12-14 ENCOUNTER — Inpatient Hospital Stay: Payer: Medicare PPO | Attending: Oncology | Admitting: Oncology

## 2018-12-14 ENCOUNTER — Inpatient Hospital Stay: Payer: Medicare PPO

## 2018-12-14 ENCOUNTER — Encounter: Payer: Self-pay | Admitting: Oncology

## 2018-12-14 ENCOUNTER — Telehealth: Payer: Self-pay | Admitting: Urology

## 2018-12-14 VITALS — BP 154/74 | HR 60 | Temp 95.9°F | Resp 16 | Ht 70.0 in | Wt 175.5 lb

## 2018-12-14 DIAGNOSIS — D751 Secondary polycythemia: Secondary | ICD-10-CM | POA: Diagnosis not present

## 2018-12-14 DIAGNOSIS — R160 Hepatomegaly, not elsewhere classified: Secondary | ICD-10-CM | POA: Insufficient documentation

## 2018-12-14 DIAGNOSIS — Z85820 Personal history of malignant melanoma of skin: Secondary | ICD-10-CM | POA: Insufficient documentation

## 2018-12-14 LAB — CBC WITH DIFFERENTIAL/PLATELET
Abs Immature Granulocytes: 0.02 10*3/uL (ref 0.00–0.07)
Basophils Absolute: 0 10*3/uL (ref 0.0–0.1)
Basophils Relative: 1 %
Eosinophils Absolute: 0.1 10*3/uL (ref 0.0–0.5)
Eosinophils Relative: 1 %
HCT: 47.6 % — ABNORMAL HIGH (ref 36.0–46.0)
Hemoglobin: 15.4 g/dL — ABNORMAL HIGH (ref 12.0–15.0)
IMMATURE GRANULOCYTES: 0 %
Lymphocytes Relative: 29 %
Lymphs Abs: 1.5 10*3/uL (ref 0.7–4.0)
MCH: 29.2 pg (ref 26.0–34.0)
MCHC: 32.4 g/dL (ref 30.0–36.0)
MCV: 90.3 fL (ref 80.0–100.0)
Monocytes Absolute: 0.4 10*3/uL (ref 0.1–1.0)
Monocytes Relative: 7 %
Neutro Abs: 3.3 10*3/uL (ref 1.7–7.7)
Neutrophils Relative %: 62 %
PLATELETS: 276 10*3/uL (ref 150–400)
RBC: 5.27 MIL/uL — ABNORMAL HIGH (ref 3.87–5.11)
RDW: 13.3 % (ref 11.5–15.5)
WBC: 5.3 10*3/uL (ref 4.0–10.5)
nRBC: 0 % (ref 0.0–0.2)

## 2018-12-14 LAB — COMPREHENSIVE METABOLIC PANEL
ALT: 32 U/L (ref 0–44)
ANION GAP: 10 (ref 5–15)
AST: 48 U/L — ABNORMAL HIGH (ref 15–41)
Albumin: 5.1 g/dL — ABNORMAL HIGH (ref 3.5–5.0)
Alkaline Phosphatase: 31 U/L — ABNORMAL LOW (ref 38–126)
BUN: 15 mg/dL (ref 8–23)
CO2: 26 mmol/L (ref 22–32)
Calcium: 9.7 mg/dL (ref 8.9–10.3)
Chloride: 101 mmol/L (ref 98–111)
Creatinine, Ser: 0.8 mg/dL (ref 0.44–1.00)
GFR calc Af Amer: >60 mL/min (ref >60)
GFR calc non Af Amer: >60 mL/min (ref >60)
GLUCOSE: 130 mg/dL — AB (ref 70–99)
Potassium: 3.8 mmol/L (ref 3.5–5.1)
Sodium: 137 mmol/L (ref 135–145)
Total Bilirubin: 0.9 mg/dL (ref 0.3–1.2)
Total Protein: 9.7 g/dL — ABNORMAL HIGH (ref 6.5–8.1)

## 2018-12-14 LAB — LACTATE DEHYDROGENASE: LDH: 166 U/L (ref 98–192)

## 2018-12-14 LAB — PROTIME-INR
INR: 0.99
Prothrombin Time: 13 seconds (ref 11.4–15.2)

## 2018-12-14 LAB — APTT: aPTT: 31 seconds (ref 24–36)

## 2018-12-14 NOTE — Progress Notes (Signed)
Hematology/Oncology Consult note [facility] Telephone:([phone] [phone]   Patient Care Team: Guadalupe Maple, MD as PCP - General (Family Medicine) Jannet Mantis, MD (Dermatology) Karren Burly Deirdre Peer, MD as Referring Physician (Ophthalmology) Clent Jacks, RN as Registered Nurse  REFERRING PROVIDER: Zara Council CHIEF COMPLAINTS/REASON FOR VISIT:  Evaluation of liver mass  HISTORY OF PRESENTING ILLNESS:  Brianna Curry is a  77 y.o.  female with PMH listed below who was referred to me for evaluation of liver mass.  Patient was recently referred to urology for recurrent urinary tract infections. Urine also showed hematuria.  As part of the hematuria work-up, patient underwent CT hematuria protocol as well as cystoscopy. CT hematuria work-up on 11/03/2018 showed large mass in right lobe of the liver, segment 7, measuring 5.6 x 7.5 x 7.7 cm No other acute findings noted in the abdomen or pelvis.  Patient has aortic atherosclerosis.  Calcifications of aortic valve and mitral annulus.  MRI liver with and without contrast was done on 12/11/2018 which showed 7.7 x 7.5 cm right hepatic lobe lesion worrisome for malignancy.  No other obvious primary seen in the abdomen or pelvis.  Favor primary hepatic cell neoplasm such as [place].  Recommend tissue biopsy.  Patient underwent cystoscopy on 11/15/2018 for evaluation of recurrent UTIs.  Cystoscopy was noted to be negative.  Patient was referred to cancer center for further evaluation and management. She was accompanied by sister and daughter today. Reports having suprapubic pain, chronic.  Denies any right upper quadrant pain or discomfort.   Denies any history of hepatitis, previous blood transfusion, jaundice, alcohol use history. She has also been referred to cardiologist for evaluation of aortic valve calcifications, mitral annulus. Denies any weight loss, fever, chills.  Appetite is  good until she received news of possible cancer diagnosis.   History of melanoma.   Review of Systems  Constitutional: Negative for appetite change, chills, fatigue and fever.  HENT:   Negative for hearing loss and voice change.   Eyes: Negative for eye problems.  Respiratory: Negative for chest tightness and cough.   Cardiovascular: Negative for chest pain.  Gastrointestinal: Negative for abdominal distention, abdominal pain and blood in stool.  Endocrine: Negative for hot flashes.  Genitourinary: Negative for difficulty urinating and frequency.        Suprapubic pain.   Musculoskeletal: Negative for arthralgias.  Skin: Negative for itching and rash.  Neurological: Negative for extremity weakness.  Hematological: Negative for adenopathy.  Psychiatric/Behavioral: Negative for confusion.    MEDICAL HISTORY:  Past Medical History:  Diagnosis Date  . Anxiety   . CAD (coronary artery disease)   . Cancer ([place])    melanomia  . GERD (gastroesophageal reflux disease)   . Glaucoma   . Hyperlipidemia   . Hypertension   . IBS (irritable bowel syndrome)     SURGICAL HISTORY: Past Surgical History:  Procedure Laterality Date  . COLONOSCOPY    . NASAL SINUS SURGERY  1976    SOCIAL HISTORY: Social History   Socioeconomic History  . Marital status: Single    Spouse name: Not on file  . Number of children: 0  . Years of education: Not on file  . Highest education level: High school graduate  Occupational History  . Not on file  Social Needs  . Financial resource strain: Not hard at all  . Food insecurity:    Worry: Never true    Inability: Never true  . Transportation needs:  Medical: No    Non-medical: No  Tobacco Use  . Smoking status: Never Smoker  . Smokeless tobacco: Never Used  Substance and Sexual Activity  . Alcohol use: No  . Drug use: No  . Sexual activity: Not on file  Lifestyle  . Physical activity:    Days per week: 0 days    Minutes per session: 0  min  . Stress: Not at all  Relationships  . Social connections:    Talks on phone: More than three times a week    Gets together: More than three times a week    Attends religious service: More than 4 times per year    Active member of club or organization: No    Attends meetings of clubs or organizations: Never    Relationship status: Never married  . Intimate partner violence:    Fear of current or ex partner: No    Emotionally abused: No    Physically abused: No    Forced sexual activity: No  Other Topics Concern  . Not on file  Social History Narrative   Lives with brother     FAMILY HISTORY: Family History  Problem Relation Age of Onset  . Hypertension Mother   . Heart attack Mother   . Hypertension Father   . Heart attack Father   . Prostate cancer Father   . Glaucoma Maternal Grandmother   . Heart attack Maternal Grandmother   . Heart disease Maternal Grandmother   . Hypertension Maternal Grandmother   . Stroke Paternal Grandmother   . Stroke Paternal Grandfather     ALLERGIES:  is allergic to sulfa antibiotics; zithromax [azithromycin]; and penicillins.  MEDICATIONS:  Current Outpatient Medications  Medication Sig Dispense Refill  . albuterol (PROVENTIL HFA;VENTOLIN HFA) 108 (90 Base) MCG/ACT inhaler Inhale 2 puffs into the lungs every 4 (four) hours as needed for wheezing or shortness of breath. 1 Inhaler 11  . aspirin EC 81 MG tablet Take 81 mg by mouth daily.    . bisoprolol-hydrochlorothiazide (ZIAC) 10-6.25 MG tablet Take 1 tablet by mouth 2 (two) times daily. 180 tablet 1  . Docusate Calcium (STOOL SOFTENER PO) Take by mouth daily.    . fenofibrate 160 MG tablet Take 1 tablet (160 mg total) by mouth daily. 90 tablet 1  . latanoprost (XALATAN) 0.005 % ophthalmic solution     . Multiple Vitamin (MULTIVITAMIN) tablet Take 1 tablet by mouth daily.    . pravastatin (PRAVACHOL) 40 MG tablet TAKE 1 TABLET (40 MG TOTAL) BY MOUTH DAILY 90 tablet 1   No current  facility-administered medications for this visit.      PHYSICAL EXAMINATION: ECOG PERFORMANCE STATUS: 0 - Asymptomatic Vitals:   12/14/18 0852  BP: (!) 154/74  Pulse: 60  Resp: 16  Temp: (!) 95.9 F (35.5 C)   Filed Weights   12/14/18 0852  Weight: 175 lb 7.8 oz (79.6 kg)    Physical Exam Constitutional:      General: She is not in acute distress. HENT:     Head: Normocephalic and atraumatic.  Eyes:     General: No scleral icterus.    Pupils: Pupils are equal, round, and reactive to light.  Neck:     Musculoskeletal: Normal range of motion and neck supple.  Cardiovascular:     Rate and Rhythm: Normal rate and regular rhythm.     Heart sounds: Normal heart sounds.  Pulmonary:     Effort: Pulmonary effort is normal. No respiratory distress.  Breath sounds: No wheezing.  Abdominal:     General: Bowel sounds are normal. There is no distension.     Palpations: Abdomen is soft. There is no mass.     Tenderness: There is no abdominal tenderness.  Musculoskeletal: Normal range of motion.        General: No deformity.  Skin:    General: Skin is warm and dry.     Findings: No erythema or rash.  Neurological:     Mental Status: She is alert and oriented to person, place, and time.     Cranial Nerves: No cranial nerve deficit.     Coordination: Coordination normal.  Psychiatric:        Behavior: Behavior normal.        Thought Content: Thought content normal.      LABORATORY DATA:  I have reviewed the data as listed Lab Results  Component Value Date   WBC 5.3 12/14/2018   HGB 15.4 (H) 12/14/2018   HCT 47.6 (H) 12/14/2018   MCV 90.3 12/14/2018   PLT 276 12/14/2018   Recent Labs    08/14/18 1106 11/03/18 1305 12/14/18 0936  NA 142  --  137  K 4.4  --  3.8  CL 102  --  101  CO2 22  --  26  GLUCOSE 107*  --  130*  BUN 15  --  15  CREATININE 0.92 0.80 0.80  CALCIUM 10.3  --  9.7  GFRNONAA 60  --  >60  GFRAA 69  --  >60  PROT 8.0  --  9.7*  ALBUMIN  4.8  --  5.1*  AST 73*  --  48*  ALT 51*  --  32  ALKPHOS 32*  --  31*  BILITOT 0.6  --  0.9   Iron/TIBC/Ferritin/ %Sat No results found for: IRON, TIBC, FERRITIN, IRONPCTSAT   RADIOGRAPHIC STUDIES: I have personally reviewed the radiological images as listed and agreed with the findings in the report. 11/03/2018 CT hematuria work-up . Large complex mass centered in the right lobe of the liver. This does have imaging characteristics that may suggest a cavernous hemangioma, however, this is not certain on today's CT examination. Further characterization with nonemergent MRI of the abdomen with and without IV gadolinium is strongly recommended in the near future to better characterize this finding, as neoplasm is not excluded. 2. No acute findings are noted in the abdomen or pelvis to account for the patient's symptoms. 3. No findings to account for the patient's history of hematuria. 4. Aortic atherosclerosis.5. There are calcifications of the aortic valve and mitral annulus.Echocardiographic correlation for evaluation of potential valvular dysfunction may be warranted if clinically indicated  12/11/2018 MRI liver with and without contrast 1. 7.7 x 7.5 cm right hepatic lobe lesion worrisome for malignancy.Although this could be a metastasis I do not see any obvious primary. I would favor a primary hepatic neoplasm such as [place].Recommend tissue biopsy. 2. No other significant abdominal findings. No adenopathy or acute abdominal process.   ASSESSMENT & PLAN:  1. Liver mass   2. History of melanoma   3. Erythrocytosis    CT image and MRI image were independently reviewed by me and discussed with patient and her family members.  Concerning for malignancy. Primary vs metastatic disease.  History of melanoma.  Recommend obtaining PET scan to determine if there other metastasis. Following PET scan results, will decide biopsy to obtain tissue diagnosis. I recommend checking baseline labs  including CBC, CMP,  PT, PTT, AFP, CA-19-9, CEA  Labs reviewed, patient has erythrocytosis with a hemoglobin 15.4, possible secondary erythrocytosis due to liver mass Patient will need to follow-up with me a week after biopsy to discuss biopsy results. Orders Placed This Encounter  Procedures  . NM PET Image Initial (PI) Skull Base To Thigh    Standing Status:   Future    Standing Expiration Date:   12/14/2019    Order Specific Question:   ** REASON FOR EXAM (FREE TEXT)    Answer:   liver mass    Order Specific Question:   If indicated for the ordered procedure, I authorize the administration of a radiopharmaceutical per Radiology protocol    Answer:   Yes    Order Specific Question:   Preferred imaging location?    Answer:   [facility]    Order Specific Question:   Radiology Contrast Protocol - do NOT remove file path    Answer:   [email or web address]  . CBC with Differential/Platelet    Standing Status:   Future    Number of Occurrences:   1    Standing Expiration Date:   12/15/2019  . Comprehensive metabolic panel    Standing Status:   Future    Number of Occurrences:   1    Standing Expiration Date:   12/15/2019  . AFP tumor marker    Standing Status:   Future    Number of Occurrences:   1    Standing Expiration Date:   12/15/2019  . CEA    Standing Status:   Future    Number of Occurrences:   1    Standing Expiration Date:   12/15/2019  . Cancer antigen 19-9    Standing Status:   Future    Number of Occurrences:   1    Standing Expiration Date:   12/15/2019  . Lactate dehydrogenase    Standing Status:   Future    Number of Occurrences:   1    Standing Expiration Date:   12/15/2019  . Protime-INR    Standing Status:   Future    Number of Occurrences:   1    Standing Expiration Date:   12/15/2019  . APTT    Standing Status:   Future    Number of Occurrences:   1    Standing Expiration Date:   12/15/2019  . Hepatitis panel, acute     Standing Status:   Future    Number of Occurrences:   1    Standing Expiration Date:   12/15/2019    All questions were answered. The patient knows to call the clinic with any problems questions or concerns.  Return of visit: to be determined.  Thank you for this kind referral and the opportunity to participate in the care of this patient. A copy of today's note is routed to referring provider  Total face to face encounter time for this patient visit was 45 min. >50% of the time was  spent in counseling and coordination of care.    Earlie Server, MD, PhD Hematology Oncology [facility] Pager- [phone] 12/14/2018

## 2018-12-14 NOTE — Telephone Encounter (Signed)
Patient called today asking why her referral has not been faxed to Dr. Ubaldo Glassing, I explained that I was waiting for all of the documentation before I can send it. I just need to notes from her visit yesterday before I can send the referral.  Thanks, Sharyn Lull

## 2018-12-15 LAB — HEPATITIS PANEL, ACUTE
HCV Ab: <0.1 s/co ratio (ref 0.0–0.9)
HEP B S AG: NEGATIVE
Hep A IgM: NEGATIVE
Hep B C IgM: NEGATIVE

## 2018-12-15 LAB — CANCER ANTIGEN 19-9: CA 19-9: 14 U/mL (ref 0–35)

## 2018-12-15 LAB — AFP TUMOR MARKER: AFP, Serum, Tumor Marker: 42.7 ng/mL — ABNORMAL HIGH (ref 0.0–8.3)

## 2018-12-15 LAB — CEA: CEA: 1.3 ng/mL (ref 0.0–4.7)

## 2018-12-15 NOTE — Telephone Encounter (Signed)
Notes completed

## 2018-12-21 ENCOUNTER — Ambulatory Visit
Admission: RE | Admit: 2018-12-21 | Discharge: 2018-12-21 | Disposition: A | Discharge status: Home / Self Care | Payer: Medicare PPO | Source: Ambulatory Visit | Attending: Oncology | Admitting: Oncology

## 2018-12-21 ENCOUNTER — Ambulatory Visit: Payer: Medicare PPO

## 2018-12-21 ENCOUNTER — Other Ambulatory Visit: Payer: Self-pay | Admitting: Oncology

## 2018-12-21 DIAGNOSIS — I700 Atherosclerosis of aorta: Secondary | ICD-10-CM | POA: Diagnosis not present

## 2018-12-21 DIAGNOSIS — R160 Hepatomegaly, not elsewhere classified: Secondary | ICD-10-CM | POA: Insufficient documentation

## 2018-12-21 LAB — GLUCOSE, CAPILLARY: GLUCOSE-CAPILLARY: 97 mg/dL (ref 70–99)

## 2018-12-21 MED ORDER — FLUDEOXYGLUCOSE F - 18 (FDG) INJECTION
9.4000 | Freq: Once | INTRAVENOUS | Status: AC | PRN
  Administered 2018-12-21: 9.4 via INTRAVENOUS

## 2018-12-22 DIAGNOSIS — I2510 Atherosclerotic heart disease of native coronary artery without angina pectoris: Secondary | ICD-10-CM | POA: Diagnosis not present

## 2018-12-22 DIAGNOSIS — R0602 Shortness of breath: Secondary | ICD-10-CM | POA: Diagnosis not present

## 2018-12-29 DIAGNOSIS — I2510 Atherosclerotic heart disease of native coronary artery without angina pectoris: Secondary | ICD-10-CM | POA: Diagnosis not present

## 2019-01-02 ENCOUNTER — Other Ambulatory Visit: Payer: Self-pay | Admitting: Family Medicine

## 2019-01-02 NOTE — Telephone Encounter (Signed)
Requested Prescriptions  Refused Prescriptions Disp Refills  . bisoprolol-hydrochlorothiazide (ZIAC) 10-6.25 MG tablet [Pharmacy Med Name: BISOPROLOL FUMARATE/HYDROCHLOROTHIAZIDE 10-6.25 MG Tablet] 180 tablet 1    Sig: TAKE 1 TABLET TWICE DAILY     Cardiovascular: Beta Blocker + Diuretic Combos Failed - 01/02/2019  3:52 PM      Failed - Last BP in normal range    BP Readings from Last 1 Encounters:  12/14/18 (!) 154/74         Passed - K in normal range and within 180 days    Potassium  Date Value Ref Range Status  12/14/2018 3.8 3.5 - 5.1 mmol/L Final         Passed - Na in normal range and within 180 days    Sodium  Date Value Ref Range Status  12/14/2018 137 135 - 145 mmol/L Final  08/14/2018 142 134 - 144 mmol/L Final         Passed - Cr in normal range and within 180 days    Creatinine, Ser  Date Value Ref Range Status  12/14/2018 0.80 0.44 - 1.00 mg/dL Final         Passed - Ca in normal range and within 180 days    Calcium  Date Value Ref Range Status  12/14/2018 9.7 8.9 - 10.3 mg/dL Final         Passed - Patient is not pregnant      Passed - Last Heart Rate in normal range    Pulse Readings from Last 1 Encounters:  12/14/18 60         Passed - Valid encounter within last 6 months    Recent Outpatient Visits          3 months ago Lower urinary tract infection   [facility] [place], [place]   3 months ago Essential hypertension   [facility] [place], [place]   4 months ago Essential hypertension   [place], [place], [place]   5 months ago Cellulitis of foot   [place], [place], [place]   12 months ago Acute lower UTI   [facility], [place], [place]      Future Appointments            In 2 months [facility], [place], [place], [place]         . pravastatin (PRAVACHOL) 40 MG tablet [Pharmacy Med Name:  PRAVASTATIN SODIUM 40 MG Tablet] 90 tablet 1    Sig: TAKE 1 TABLET EVERY DAY     Cardiovascular:  Antilipid - Statins Failed - 01/02/2019  3:52 PM      Failed - LDL in normal range and within 360 days    LDL Calculated  Date Value Ref Range Status  08/14/2018 108 (H) 0 - 99 mg/dL Final         Failed - HDL in normal range and within 360 days    HDL  Date Value Ref Range Status  08/14/2018 38 (L) >39 mg/dL Final         Failed - Triglycerides in normal range and within 360 days    Triglycerides  Date Value Ref Range Status  08/14/2018 155 (H) 0 - 149 mg/dL Final   Triglycerides Piccolo,Waived  Date Value Ref Range Status  10/01/2015 134 <150 mg/dL Final    Comment:  Normal                   <150                         Borderline High     150 - 199                         High                200 - 499                         Very High                >499          Passed - Total Cholesterol in normal range and within 360 days    Cholesterol, Total  Date Value Ref Range Status  08/14/2018 177 100 - 199 mg/dL Final   Cholesterol Piccolo, Waived  Date Value Ref Range Status  10/01/2015 181 <200 mg/dL Final    Comment:                            Desirable                <200                         Borderline High      200- 239                         High                     >239          Passed - Patient is not pregnant      Passed - Valid encounter within last 12 months    Recent Outpatient Visits          3 months ago Lower urinary tract infection   [place], [employer], [place]   3 months ago Essential hypertension   [facility] [place], [place]   4 months ago Essential hypertension   [place], [place], [place]   5 months ago Cellulitis of foot   [place], [place], [place]   12 months ago Acute lower UTI   [facility], [place], [place]      Future Appointments            In 2 months [facility], [place], [place], [place]

## 2019-01-03 DIAGNOSIS — R0602 Shortness of breath: Secondary | ICD-10-CM | POA: Diagnosis not present

## 2019-01-03 MED ORDER — BISOPROLOL-HYDROCHLOROTHIAZIDE 10-6.25 MG PO TABS: 1.0000 | ORAL_TABLET | Freq: Two times a day (BID) | ORAL | 1 refills | Status: AC

## 2019-01-03 NOTE — Telephone Encounter (Signed)
rx sent

## 2019-01-08 DIAGNOSIS — I2510 Atherosclerotic heart disease of native coronary artery without angina pectoris: Secondary | ICD-10-CM | POA: Diagnosis not present

## 2019-01-10 ENCOUNTER — Other Ambulatory Visit: Payer: Self-pay | Admitting: Oncology

## 2019-01-10 DIAGNOSIS — R160 Hepatomegaly, not elsewhere classified: Secondary | ICD-10-CM

## 2019-01-11 ENCOUNTER — Other Ambulatory Visit: Payer: Self-pay | Admitting: Radiology

## 2019-01-12 ENCOUNTER — Telehealth: Payer: Self-pay | Admitting: *Deleted

## 2019-01-12 ENCOUNTER — Ambulatory Visit
Admission: RE | Admit: 2019-01-12 | Discharge: 2019-01-12 | Disposition: A | Discharge status: Home / Self Care | Payer: Medicare PPO | Source: Ambulatory Visit | Attending: Oncology | Admitting: Oncology

## 2019-01-12 DIAGNOSIS — R160 Hepatomegaly, not elsewhere classified: Secondary | ICD-10-CM | POA: Insufficient documentation

## 2019-01-12 DIAGNOSIS — Z79899 Other long term (current) drug therapy: Secondary | ICD-10-CM | POA: Diagnosis not present

## 2019-01-12 DIAGNOSIS — Z83511 Family history of glaucoma: Secondary | ICD-10-CM | POA: Insufficient documentation

## 2019-01-12 DIAGNOSIS — Z882 Allergy status to sulfonamides status: Secondary | ICD-10-CM | POA: Diagnosis not present

## 2019-01-12 DIAGNOSIS — I700 Atherosclerosis of aorta: Secondary | ICD-10-CM | POA: Diagnosis not present

## 2019-01-12 DIAGNOSIS — I2510 Atherosclerotic heart disease of native coronary artery without angina pectoris: Secondary | ICD-10-CM | POA: Insufficient documentation

## 2019-01-12 DIAGNOSIS — I10 Essential (primary) hypertension: Secondary | ICD-10-CM | POA: Diagnosis not present

## 2019-01-12 DIAGNOSIS — Z7982 Long term (current) use of aspirin: Secondary | ICD-10-CM | POA: Insufficient documentation

## 2019-01-12 DIAGNOSIS — C220 Liver cell carcinoma: Secondary | ICD-10-CM | POA: Diagnosis not present

## 2019-01-12 DIAGNOSIS — Z881 Allergy status to other antibiotic agents status: Secondary | ICD-10-CM | POA: Insufficient documentation

## 2019-01-12 DIAGNOSIS — H409 Unspecified glaucoma: Secondary | ICD-10-CM | POA: Diagnosis not present

## 2019-01-12 DIAGNOSIS — Z8249 Family history of ischemic heart disease and other diseases of the circulatory system: Secondary | ICD-10-CM | POA: Diagnosis not present

## 2019-01-12 DIAGNOSIS — E785 Hyperlipidemia, unspecified: Secondary | ICD-10-CM | POA: Diagnosis not present

## 2019-01-12 DIAGNOSIS — Z880 Allergy status to penicillin: Secondary | ICD-10-CM | POA: Diagnosis not present

## 2019-01-12 LAB — CBC
HCT: 45.7 % (ref 36.0–46.0)
Hemoglobin: 14.6 g/dL (ref 12.0–15.0)
MCH: 29.6 pg (ref 26.0–34.0)
MCHC: 31.9 g/dL (ref 30.0–36.0)
MCV: 92.5 fL (ref 80.0–100.0)
Platelets: 255 10*3/uL (ref 150–400)
RBC: 4.94 MIL/uL (ref 3.87–5.11)
RDW: 13.2 % (ref 11.5–15.5)
WBC: 5.7 10*3/uL (ref 4.0–10.5)
nRBC: 0 % (ref 0.0–0.2)

## 2019-01-12 LAB — PROTIME-INR
INR: 1
PROTHROMBIN TIME: 13.1 s (ref 11.4–15.2)

## 2019-01-12 MED ORDER — ONDANSETRON HCL 4 MG/2ML IJ SOLN
INTRAMUSCULAR | Status: AC
  Filled 2019-01-12: qty 2

## 2019-01-12 MED ORDER — SODIUM CHLORIDE 0.9 % IV SOLN
INTRAVENOUS | Status: DC
  Administered 2019-01-12: 08:00:00 via INTRAVENOUS

## 2019-01-12 MED ORDER — MIDAZOLAM HCL 5 MG/5ML IJ SOLN
INTRAMUSCULAR | Status: AC | PRN
  Administered 2019-01-12 (×2): 1 mg via INTRAVENOUS

## 2019-01-12 MED ORDER — FENTANYL CITRATE (PF) 100 MCG/2ML IJ SOLN
INTRAMUSCULAR | Status: AC
  Filled 2019-01-12: qty 4

## 2019-01-12 MED ORDER — MIDAZOLAM HCL 5 MG/5ML IJ SOLN
INTRAMUSCULAR | Status: AC
  Filled 2019-01-12: qty 5

## 2019-01-12 MED ORDER — FENTANYL CITRATE (PF) 100 MCG/2ML IJ SOLN
INTRAMUSCULAR | Status: AC | PRN
  Administered 2019-01-12: 50 ug via INTRAVENOUS
  Administered 2019-01-12: 25 ug via INTRAVENOUS

## 2019-01-12 NOTE — Telephone Encounter (Signed)
See MD in 1 week for biopsy results per Benjamine Mola 01/12/19 scheduling message Appt was scheduled as requested I called patient and made her aware of the scheduled date and time of her appt.

## 2019-01-12 NOTE — Procedures (Signed)
Liver mass  S/p US LIVER CORE BX  No comp Stable EBL min Path pending Full report in pacs

## 2019-01-12 NOTE — H&P (Signed)
Chief Complaint:   Hepatic mass   Referring Physician(s): Yu,Zhou    History of Present Illness: Brianna Curry is a 78 y.o. female found to have a large liver mass by CT during a hematuria workup.  Lesion is c/w malignancy either met dz, HCC or cholangioca.  Here for [place] bx today.  Past Medical History:  Diagnosis Date  . Anxiety   . CAD (coronary artery disease)   . Cancer ([place])    melanomia  . GERD (gastroesophageal reflux disease)   . Glaucoma   . Hyperlipidemia   . Hypertension   . IBS (irritable bowel syndrome)     Past Surgical History:  Procedure Laterality Date  . COLONOSCOPY    . NASAL SINUS SURGERY  1976    Allergies: Sulfa antibiotics; Zithromax [azithromycin]; and Penicillins  Medications: Prior to Admission medications   Medication Sig Start Date End Date Taking? Authorizing Provider  albuterol (PROVENTIL HFA;VENTOLIN HFA) 108 (90 Base) MCG/ACT inhaler Inhale 2 puffs into the lungs every 4 (four) hours as needed for wheezing or shortness of breath. 08/14/18  Yes Volney American, PA-C  aspirin EC 81 MG tablet Take 81 mg by mouth daily.   Yes [provider]  bisoprolol-hydrochlorothiazide (ZIAC) 10-6.25 MG tablet Take 1 tablet by mouth 2 (two) times daily. 01/03/19  Yes Volney American, PA-C  Docusate Calcium (STOOL SOFTENER PO) Take by mouth daily.   Yes [provider]  fenofibrate 160 MG tablet Take 1 tablet (160 mg total) by mouth daily. 08/14/18  Yes Volney American, PA-C  latanoprost Ivin Poot) 0.005 % ophthalmic solution  07/18/15  Yes [provider]  Multiple Vitamin (MULTIVITAMIN) tablet Take 1 tablet by mouth daily.   Yes [provider]  pravastatin (PRAVACHOL) 40 MG tablet TAKE 1 TABLET (40 MG TOTAL) BY MOUTH DAILY 08/14/18  Yes Volney American, PA-C     Family History  Problem Relation Age of Onset  . Hypertension Mother   . Heart attack Mother   . Hypertension Father   .  Heart attack Father   . Prostate cancer Father   . Glaucoma Maternal Grandmother   . Heart attack Maternal Grandmother   . Heart disease Maternal Grandmother   . Hypertension Maternal Grandmother   . Stroke Paternal Grandmother   . Stroke Paternal Grandfather     Social History   Socioeconomic History  . Marital status: Single    Spouse name: Not on file  . Number of children: 0  . Years of education: Not on file  . Highest education level: High school graduate  Occupational History  . Not on file  Social Needs  . Financial resource strain: Not hard at all  . Food insecurity:    Worry: Never true    Inability: Never true  . Transportation needs:    Medical: No    Non-medical: No  Tobacco Use  . Smoking status: Never Smoker  . Smokeless tobacco: Never Used  Substance and Sexual Activity  . Alcohol use: No  . Drug use: No  . Sexual activity: Not on file  Lifestyle  . Physical activity:    Days per week: 0 days    Minutes per session: 0 min  . Stress: Not at all  Relationships  . Social connections:    Talks on phone: More than three times a week    Gets together: More than three times a week    Attends religious service:  More than 4 times per year    Active member of club or organization: No    Attends meetings of clubs or organizations: Never    Relationship status: Never married  Other Topics Concern  . Not on file  Social History Narrative   Lives with brother     ECOG Status: 1 - Symptomatic but completely ambulatory  Review of Systems: A 12 point ROS discussed and pertinent positives are indicated in the HPI above.  All other systems are negative.  Review of Systems  Vital Signs: BP (!) 142/65   Pulse 61   Temp (!) 97.5 F (36.4 C) (Oral)   Resp 16   Ht _0  (1.778 m)   Wt 80.3 kg   SpO2 97%   BMI 25.40 kg/m   Physical Exam Constitutional:      General: She is not in acute distress.    Appearance: She is obese. She is not  toxic-appearing.  Eyes:     General: No scleral icterus.    Conjunctiva/sclera: Conjunctivae normal.  Cardiovascular:     Rate and Rhythm: Normal rate and regular rhythm.     Heart sounds: No murmur.  Pulmonary:     Effort: Pulmonary effort is normal.     Breath sounds: Normal breath sounds.  Abdominal:     General: Bowel sounds are normal.     Palpations: Abdomen is soft.  Neurological:     General: No focal deficit present.     Mental Status: She is alert.  Psychiatric:        Mood and Affect: Mood normal.     Imaging: Nm Pet Image Initial (pi) Skull Base To Thigh  Result Date: 12/21/2018 CLINICAL DATA:  Initial treatment strategy for liver mass. EXAM: NUCLEAR MEDICINE PET SKULL BASE TO THIGH TECHNIQUE: 9.4 mCi F-18 FDG was injected intravenously. Full-ring PET imaging was performed from the skull base to thigh after the radiotracer. CT data was obtained and used for attenuation correction and anatomic localization. Fasting blood glucose: 97 mg/dl COMPARISON:  Multiple exams, including MRI dated 12/11/2018 FINDINGS: Mediastinal blood pool activity: SUV max 2.6. Background normal hepatic activity: Maximum SUV 4.3 NECK: Low-level tonsillar glottic activity, thought to be physiologic. Incidental CT findings: none CHEST: No significant abnormal hypermetabolic activity in this region. Incidental CT findings: Coronary, aortic arch, and branch vessel atherosclerotic vascular disease. 0.3 by 0.4 cm nodule along the right minor fissure is likely a small subpleural lymph node. ABDOMEN/PELVIS: Hypodense mass in segments 6 and 7 of the liver has a maximum SUV of 7.0, and evidence of central necrosis. No other appreciable liver lesions. Activity associated with the left common iliac vein, maximum SUV 4.9, without adjacent appreciable adenopathy, significance uncertain. This area looked normal on the recent CT abdomen from 11/03/2018. Incidental CT findings: Aortoiliac atherosclerotic vascular disease.  SKELETON: No significant abnormal hypermetabolic activity in this region. Incidental CT findings: Incidental pectus excavatum. IMPRESSION: 1. The posterior right hepatic lobe mass has a maximum SUV of 7.0, compatible with malignancy and particularly worrisome for hepatocellular carcinoma. Probable central necrosis. Tissue diagnosis recommended. 2. No findings of metastatic spread. 3.  Aortic Atherosclerosis (ICD10-I70.0). Electronically Signed   By: Van Clines M.D.   On: 12/21/2018 11:58    Labs:  CBC: Recent Labs    08/14/18 1106 12/14/18 0936 01/12/19 0740  WBC 3.3* 5.3 5.7  HGB 14.6 15.4* 14.6  HCT 43.0 47.6* 45.7  PLT 236 276 255    COAGS: Recent Labs  12/14/18 0936 01/12/19 0740  INR 0.99 1.00  APTT 31  --     BMP: Recent Labs    08/14/18 1106 11/03/18 1305 12/14/18 0936  NA 142  --  137  K 4.4  --  3.8  CL 102  --  101  CO2 22  --  26  GLUCOSE 107*  --  130*  BUN 15  --  15  CALCIUM 10.3  --  9.7  CREATININE 0.92 0.80 0.80  GFRNONAA 60  --  >60  GFRAA 69  --  >60    LIVER FUNCTION TESTS: Recent Labs    08/14/18 1106 12/14/18 0936  BILITOT 0.6 0.9  AST 73* 48*  ALT 51* 32  ALKPHOS 32* 31*  PROT 8.0 9.7*  ALBUMIN 4.8 5.1*    TUMOR MARKERS: No results for input(s): AFPTM, CEA, CA199, CHROMGRNA in the last 8760 hours.  Assessment and Plan:  Large rt posterior liver mass c/w malignancy.  Plan for US biopsy today.  Risks and benefits discussed with the patient including, but not limited to bleeding, infection, damage to adjacent structures or low yield requiring additional tests.  All of the patient's questions were answered, patient is agreeable to proceed. Consent signed and in chart.    Thank you for this interesting consult.  I greatly enjoyed meeting BRENDALEE MATTHIES and look forward to participating in their care.  A copy of this report was sent to the requesting provider on this date.  Electronically Signed: Greggory Keen,  MD 01/12/2019, 10:00 AM   I spent a total of  15 Minutes   in face to face in clinical consultation, greater than 50% of which was counseling/coordinating care for this patient with a newly found liver mass

## 2019-01-12 NOTE — Progress Notes (Signed)
Patient clinically stable post liver biopsy per Dr Annamaria Boots. Tolerated well. bandade dressing right upper lateral abdomen dry and intact. Brother at bedside. Sinus rhythm per monitor. Dr Annamaria Boots out to speak with patient/brother with questions answered.

## 2019-01-12 NOTE — Discharge Instructions (Signed)
Moderate Conscious Sedation, Adult, Care After  These instructions provide you with information about caring for yourself after your procedure. Your health care provider may also give you more specific instructions. Your treatment has been planned according to current medical practices, but problems sometimes occur. Call your health care provider if you have any problems or questions after your procedure.  What can I expect after the procedure?  After your procedure, it is common:  · To feel sleepy for several hours.  · To feel clumsy and have poor balance for several hours.  · To have poor judgment for several hours.  · To vomit if you eat too soon.  Follow these instructions at home:  For at least 24 hours after the procedure:    · Do not:  ? Participate in activities where you could fall or become injured.  ? Drive.  ? Use heavy machinery.  ? Drink alcohol.  ? Take sleeping pills or medicines that cause drowsiness.  ? Make important decisions or sign legal documents.  ? Take care of children on your own.  · Rest.  Eating and drinking  · Follow the diet recommended by your health care provider.  · If you vomit:  ? Drink water, juice, or soup when you can drink without vomiting.  ? Make sure you have little or no nausea before eating solid foods.  General instructions  · Have a responsible adult stay with you until you are awake and alert.  · Take over-the-counter and prescription medicines only as told by your health care provider.  · If you smoke, do not smoke without supervision.  · Keep all follow-up visits as told by your health care provider. This is important.  Contact a health care provider if:  · You keep feeling nauseous or you keep vomiting.  · You feel light-headed.  · You develop a rash.  · You have a fever.  Get help right away if:  · You have trouble breathing.  This information is not intended to replace advice given to you by your health care provider. Make sure you discuss any questions you have  with your health care provider.  Document Released: 10/03/2013 Document Revised: 05/17/2016 Document Reviewed: 04/03/2016  Elsevier Interactive Patient Education © 2019 [employer]  Liver Biopsy, Care After  These instructions give you information on caring for yourself after your procedure. Your doctor may also give you more specific instructions. Call your doctor if you have any problems or questions after your procedure.  What can I expect after the procedure?  After the procedure, it is common to have:  · Pain and soreness where the biopsy was done.  · Bruising around the area where the biopsy was done.  · Sleepiness and be tired for a few days.  Follow these instructions at home:  Medicines  · Take over-the-counter and prescription medicines only as told by your doctor.  · If you were prescribed an antibiotic medicine, take it as told by your doctor. Do not stop taking the antibiotic even if you start to feel better.  · Do not take medicines such as aspirin and ibuprofen. These medicines can thin your blood. Do not take these medicines unless your doctor tells you to take them.  · If you are taking prescription pain medicine, take actions to prevent or treat constipation. Your doctor may recommend that you:  ? Drink enough fluid to keep your pee (urine) clear or pale yellow.  ? Take over-the-counter or   prescription medicines.  ? Eat foods that are high in fiber, such as fresh fruits and vegetables, whole grains, and beans.  ? Limit foods that are high in fat and processed sugars, such as fried and sweet foods.  Caring for your cut  · Follow instructions from your doctor about how to take care of your cuts from surgery (incisions). Make sure you:  ? Wash your hands with soap and water before you change your bandage (dressing). If you cannot use soap and water, use hand sanitizer.  ? Change your bandage as told by your doctor.  ? Leave stitches (sutures), skin glue, or skin tape (adhesive) strips in place. They  may need to stay in place for 2 weeks or longer. If tape strips get loose and curl up, you may trim the loose edges. Do not remove tape strips completely unless your doctor says it is okay.  · Check your cuts every day for signs of infection. Check for:  ? Redness, swelling, or more pain.  ? Fluid or blood.  ? Pus or a bad smell.  ? Warmth.  · Do not take baths, swim, or use a hot tub until your doctor says it is okay to do so.  Activity    · Rest at home for 1-2 days or as told by your doctor.  ? Avoid sitting for a long time without moving. Get up to take short walks every 1-2 hours.  · Return to your normal activities as told by your doctor. Ask what activities are safe for you.  · Do not do these things in the first 24 hours:  ? Drive.  ? Use machinery.  ? Take a bath or shower.  · Do not lift more than 10 pounds (4.5 kg) or play contact sports for the first 2 weeks.  General instructions    · Do not drink alcohol in the first week after the procedure.  · Have someone stay with you for at least 24 hours after the procedure.  · Get your test results. Ask your doctor or the department that is doing the test:  ? When will my results be ready?  ? How will I get my results?  ? What are my treatment options?  ? What other tests do I need?  ? What are my next steps?  · Keep all follow-up visits as told by your doctor. This is important.  Contact a doctor if:  · A cut bleeds and leaves more than just a small spot of blood.  · A cut is red, puffs up (swells), or hurts more than before.  · Fluid or something else comes from a cut.  · A cut smells bad.  · You have a fever or chills.  Get help right away if:  · You have swelling, bloating, or pain in your belly (abdomen).  · You get dizzy or faint.  · You have a rash.  · You feel sick to your stomach (nauseous) or throw up (vomit).  · You have trouble breathing, feel short of breath, or feel faint.  · Your chest hurts.  · You have problems talking or seeing.  · You have  trouble with your balance or moving your arms or legs.  Summary  · After the procedure, it is common to have pain, soreness, bruising, and tiredness.  · Your doctor will tell you how to take care of yourself at home. Change your bandage, take your medicines, and limit your activities   as told by your doctor.  · Call your doctor if you have symptoms of infection. Get help right away if your belly swells, your cut bleeds a lot, or you have trouble talking or breathing.  This information is not intended to replace advice given to you by your health care provider. Make sure you discuss any questions you have with your health care provider.  Document Released: 09/21/2008 Document Revised: 12/23/2017 Document Reviewed: 12/23/2017  Elsevier Interactive Patient Education © 2019 [employer]

## 2019-01-13 ENCOUNTER — Other Ambulatory Visit: Payer: Self-pay | Admitting: Family Medicine

## 2019-01-13 DIAGNOSIS — E785 Hyperlipidemia, unspecified: Secondary | ICD-10-CM

## 2019-01-15 LAB — SURGICAL PATHOLOGY

## 2019-01-16 ENCOUNTER — Other Ambulatory Visit: Payer: Self-pay | Admitting: Oncology

## 2019-01-18 ENCOUNTER — Other Ambulatory Visit: Payer: Medicare PPO

## 2019-01-18 ENCOUNTER — Encounter: Payer: Self-pay | Admitting: Oncology

## 2019-01-18 ENCOUNTER — Other Ambulatory Visit: Payer: Self-pay

## 2019-01-18 ENCOUNTER — Inpatient Hospital Stay: Payer: Medicare PPO | Attending: Oncology | Admitting: Oncology

## 2019-01-18 VITALS — BP 151/82 | HR 57 | Temp 96.7°F | Resp 18 | Wt 176.9 lb

## 2019-01-18 DIAGNOSIS — Z7982 Long term (current) use of aspirin: Secondary | ICD-10-CM | POA: Insufficient documentation

## 2019-01-18 DIAGNOSIS — I10 Essential (primary) hypertension: Secondary | ICD-10-CM | POA: Diagnosis not present

## 2019-01-18 DIAGNOSIS — Z8042 Family history of malignant neoplasm of prostate: Secondary | ICD-10-CM | POA: Insufficient documentation

## 2019-01-18 DIAGNOSIS — C220 Liver cell carcinoma: Secondary | ICD-10-CM | POA: Insufficient documentation

## 2019-01-18 DIAGNOSIS — Z85820 Personal history of malignant melanoma of skin: Secondary | ICD-10-CM | POA: Diagnosis not present

## 2019-01-18 DIAGNOSIS — Z87440 Personal history of urinary (tract) infections: Secondary | ICD-10-CM | POA: Insufficient documentation

## 2019-01-18 DIAGNOSIS — Z79899 Other long term (current) drug therapy: Secondary | ICD-10-CM | POA: Insufficient documentation

## 2019-01-18 NOTE — Progress Notes (Signed)
Hematology/Oncology Consult note [address] [facility] [phone] [phone]   Patient Care Team: Guadalupe Maple, MD as PCP - General (Family Medicine) Jannet Mantis, MD (Dermatology) Karren Burly Deirdre Peer, MD as Referring Physician (Ophthalmology) Clent Jacks, RN as Registered Nurse  REFERRING PROVIDER: Zara Council CHIEF COMPLAINTS/REASON FOR VISIT:  Evaluation of liver mass  HISTORY OF PRESENTING ILLNESS:  Brianna Curry is a  78 y.o.  female with PMH listed below who was referred to me for evaluation of liver mass.  Patient was recently referred to urology for recurrent urinary tract infections. Urine also showed hematuria.  As part of the hematuria work-up, patient underwent CT hematuria protocol as well as cystoscopy. CT hematuria work-up on 11/03/2018 showed large mass in right lobe of the liver, segment 7, measuring 5.6 x 7.5 x 7.7 cm No other acute findings noted in the abdomen or pelvis.  Patient has aortic atherosclerosis.  Calcifications of aortic valve and mitral annulus.  MRI liver with and without contrast was done on 12/11/2018 which showed 7.7 x 7.5 cm right hepatic lobe lesion worrisome for malignancy.  No other obvious primary seen in the abdomen or pelvis.  Favor primary hepatic cell neoplasm such as [place].  Recommend tissue biopsy.  Patient underwent cystoscopy on 11/15/2018 for evaluation of recurrent UTIs.  Cystoscopy was noted to be negative.  Patient was referred to cancer center for further evaluation and management. She was accompanied by sister and daughter today. Reports having suprapubic pain, chronic.  Denies any right upper quadrant pain or discomfort.   Denies any history of hepatitis, previous blood transfusion, jaundice, alcohol use history. She has also been referred to cardiologist for evaluation of aortic valve calcifications, mitral annulus. Denies any weight loss, fever, chills.  Appetite is  good until she received news of possible cancer diagnosis.   History of melanoma.   INTERVAL HISTORY Brianna Curry is a 78 y.o. female who has above history reviewed by me today presents for follow up visit for management of liver mass. During the interval patient has had PET scan and biopsy of liver mass. Pathology showed well differentiated [place]. AFP elevated at 42.7 Denies any nausea vomiting, jaundice, abdominal pain.  Feels nervous.   Review of Systems  Constitutional: Negative for appetite change, chills, fatigue and fever.  HENT:   Negative for hearing loss and voice change.   Eyes: Negative for eye problems.  Respiratory: Negative for chest tightness and cough.   Cardiovascular: Negative for chest pain.  Gastrointestinal: Negative for abdominal distention, abdominal pain and blood in stool.  Endocrine: Negative for hot flashes.  Genitourinary: Negative for difficulty urinating and frequency.        Suprapubic pain.   Musculoskeletal: Negative for arthralgias.  Skin: Negative for itching and rash.  Neurological: Negative for extremity weakness.  Hematological: Negative for adenopathy.  Psychiatric/Behavioral: Negative for confusion.    MEDICAL HISTORY:  Past Medical History:  Diagnosis Date  . Anxiety   . CAD (coronary artery disease)   . Cancer ([place])    melanomia  . GERD (gastroesophageal reflux disease)   . Glaucoma   . Hyperlipidemia   . Hypertension   . IBS (irritable bowel syndrome)     SURGICAL HISTORY: Past Surgical History:  Procedure Laterality Date  . COLONOSCOPY    . NASAL SINUS SURGERY  1976    SOCIAL HISTORY: Social History   Socioeconomic History  . Marital status: Single    Spouse name: Not on file  .  Number of children: 0  . Years of education: Not on file  . Highest education level: High school graduate  Occupational History  . Not on file  Social Needs  . Financial resource strain: Not hard at all  . Food insecurity:    Worry:  Never true    Inability: Never true  . Transportation needs:    Medical: No    Non-medical: No  Tobacco Use  . Smoking status: Never Smoker  . Smokeless tobacco: Never Used  Substance and Sexual Activity  . Alcohol use: No  . Drug use: No  . Sexual activity: Not on file  Lifestyle  . Physical activity:    Days per week: 0 days    Minutes per session: 0 min  . Stress: Not at all  Relationships  . Social connections:    Talks on phone: More than three times a week    Gets together: More than three times a week    Attends religious service: More than 4 times per year    Active member of club or organization: No    Attends meetings of clubs or organizations: Never    Relationship status: Never married  . Intimate partner violence:    Fear of current or ex partner: No    Emotionally abused: No    Physically abused: No    Forced sexual activity: No  Other Topics Concern  . Not on file  Social History Narrative   Lives with brother     FAMILY HISTORY: Family History  Problem Relation Age of Onset  . Hypertension Mother   . Heart attack Mother   . Hypertension Father   . Heart attack Father   . Prostate cancer Father   . Glaucoma Maternal Grandmother   . Heart attack Maternal Grandmother   . Heart disease Maternal Grandmother   . Hypertension Maternal Grandmother   . Stroke Paternal Grandmother   . Stroke Paternal Grandfather     ALLERGIES:  is allergic to sulfa antibiotics; zithromax [azithromycin]; and penicillins.  MEDICATIONS:  Current Outpatient Medications  Medication Sig Dispense Refill  . albuterol (PROVENTIL HFA;VENTOLIN HFA) 108 (90 Base) MCG/ACT inhaler Inhale 2 puffs into the lungs every 4 (four) hours as needed for wheezing or shortness of breath. 1 Inhaler 11  . bisoprolol-hydrochlorothiazide (ZIAC) 10-6.25 MG tablet Take 1 tablet by mouth 2 (two) times daily. 180 tablet 1  . Docusate Calcium (STOOL SOFTENER PO) Take by mouth daily as needed.     .  fenofibrate 160 MG tablet TAKE 1 TABLET EVERY DAY 90 tablet 1  . isosorbide mononitrate (IMDUR) 30 MG 24 hr tablet Take 30 mg by mouth daily.    Marland Kitchen latanoprost (XALATAN) 0.005 % ophthalmic solution     . Multiple Vitamin (MULTIVITAMIN) tablet Take 1 tablet by mouth daily.    Marland Kitchen aspirin EC 81 MG tablet Take 81 mg by mouth daily.     No current facility-administered medications for this visit.      PHYSICAL EXAMINATION: ECOG PERFORMANCE STATUS: 0 - Asymptomatic Vitals:   01/18/19 0845  BP: (!) 151/82  Pulse: (!) 57  Resp: 18  Temp: (!) 96.7 F (35.9 C)   Filed Weights   01/18/19 0845  Weight: 176 lb 14.4 oz (80.2 kg)    Physical Exam Constitutional:      General: She is not in acute distress. HENT:     Head: Normocephalic and atraumatic.  Eyes:     General: No scleral icterus.  Pupils: Pupils are equal, round, and reactive to light.  Neck:     Musculoskeletal: Normal range of motion and neck supple.  Cardiovascular:     Rate and Rhythm: Normal rate and regular rhythm.     Heart sounds: Normal heart sounds.  Pulmonary:     Effort: Pulmonary effort is normal. No respiratory distress.     Breath sounds: No wheezing.  Abdominal:     General: Bowel sounds are normal. There is no distension.     Palpations: Abdomen is soft. There is no mass.     Tenderness: There is no abdominal tenderness.  Musculoskeletal: Normal range of motion.        General: No deformity.  Skin:    General: Skin is warm and dry.     Findings: No erythema or rash.  Neurological:     Mental Status: She is alert and oriented to person, place, and time.     Cranial Nerves: No cranial nerve deficit.     Coordination: Coordination normal.  Psychiatric:        Behavior: Behavior normal.        Thought Content: Thought content normal.      LABORATORY DATA:  I have reviewed the data as listed Lab Results  Component Value Date   WBC 5.7 01/12/2019   HGB 14.6 01/12/2019   HCT 45.7 01/12/2019    MCV 92.5 01/12/2019   PLT 255 01/12/2019   Recent Labs    08/14/18 1106 11/03/18 1305 12/14/18 0936  NA 142  --  137  K 4.4  --  3.8  CL 102  --  101  CO2 22  --  26  GLUCOSE 107*  --  130*  BUN 15  --  15  CREATININE 0.92 0.80 0.80  CALCIUM 10.3  --  9.7  GFRNONAA 60  --  >60  GFRAA 69  --  >60  PROT 8.0  --  9.7*  ALBUMIN 4.8  --  5.1*  AST 73*  --  48*  ALT 51*  --  32  ALKPHOS 32*  --  31*  BILITOT 0.6  --  0.9   AFP 42.7  RADIOGRAPHIC STUDIES: I have personally reviewed the radiological images as listed and agreed with the findings in the report. 11/03/2018 CT hematuria work-up . Large complex mass centered in the right lobe of the liver. This does have imaging characteristics that may suggest a cavernous hemangioma, however, this is not certain on today's CT examination. Further characterization with nonemergent MRI of the abdomen with and without IV gadolinium is strongly recommended in the near future to better characterize this finding, as neoplasm is not excluded. 2. No acute findings are noted in the abdomen or pelvis to account for the patient's symptoms. 3. No findings to account for the patient's history of hematuria. 4. Aortic atherosclerosis.5. There are calcifications of the aortic valve and mitral annulus.Echocardiographic correlation for evaluation of potential valvular dysfunction may be warranted if clinically indicated  12/11/2018 MRI liver with and without contrast 1. 7.7 x 7.5 cm right hepatic lobe lesion worrisome for malignancy.Although this could be a metastasis I do not see any obvious primary. I would favor a primary hepatic neoplasm such as [place].Recommend tissue biopsy. 2. No other significant abdominal findings. No adenopathy or acute abdominal process.  PET scan 12/21/2018 1. The posterior right hepatic lobe mass has a maximum SUV of 7.0, compatible with malignancy and particularly worrisome for hepatocellular carcinoma. Probable central  necrosis.  Tissue diagnosis recommended. 2. No findings of metastatic spread. 3.  Aortic Atherosclerosis (ICD10-I70.0).  ASSESSMENT & PLAN:  1. 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 History of melanoma    PET scan was independent reviewed by me and discussed with patient.  No findings of distant metastatic disease. Patient underwent liver mass biopsy Pathology showed hepatocellular carcinoma, well-differentiated. AFP elevated 42 consistent with pathological diagnosis. I will discuss patient's case on today's tumor board. Hepatitis panel negative.  No history of alcoholism. ChildPugh score A Refer to [facility] hepatobiliary surgeon for evaluation of resectability.  If she is not a candidate we will explore options of local therapy.  Her case was discussed at tumor board on 01/18/2019. Consensus reached about referring for surgery resection evaluation.    We spent sufficient time to discuss many aspect of care, questions were answered to patient's satisfaction.  The patient knows to call the clinic with any problems questions or concerns.  Return of visit: to be determined.  Total face to face encounter time for this patient visit was 25 min. >50% of the time was  spent in counseling and coordination of care.   Earlie Server, MD, PhD Hematology Oncology [facility] Pager- [phone] 01/18/2019

## 2019-01-18 NOTE — Progress Notes (Signed)
Tumor Board Documentation  Brianna Curry was presented by Dr Tasia Catchings our Tumor Board on 01/18/2019, which included representatives from medical oncology, radiation oncology, surgical, radiology, pathology, navigation, internal medicine, palliative care, research, nutrition, pulmonology.  Brianna Curry currently presents as a new patient, for discussion, for new positive pathology, for [place] with history of the following treatments: surgical intervention(s).  Additionally, we reviewed previous medical and familial history, history of present illness, and recent lab results along with all available histopathologic and imaging studies. The tumor board considered available treatment options and made the following recommendations: Surgery    The following procedures/referrals were also placed: No orders of the defined types were placed in this encounter.   Clinical Trial Status: not discussed   Staging used: AJCC Stage Group  T1b  National site-specific guidelines NCCN were discussed with respect to the case.  Tumor board is a meeting of clinicians from various specialty areas who evaluate and discuss patients for whom a multidisciplinary approach is being considered. Final determinations in the plan of care are those of the provider(s). The responsibility for follow up of recommendations given during tumor board is that of the provider.   Today's extended care, comprehensive team conference, [place] was not present for the discussion and was not examined.   Multidisciplinary Tumor Board is a multidisciplinary case peer review process.  Decisions discussed in the Multidisciplinary Tumor Board reflect the opinions of the specialists present at the conference without having examined the patient.  Ultimately, treatment and diagnostic decisions rest with the primary provider(s) and the patient.

## 2019-01-18 NOTE — Progress Notes (Signed)
Met with Ms. Brianna Curry and her brother following Dr. Tasia Catchings providing her with liver biopsy results. Dr. Tasia Catchings is discussing her case at tumor board today. I will call her following with recommendations.

## 2019-01-18 NOTE — Progress Notes (Signed)
Patient here for follow up. Aspirin has been on hold since biopsy and she is wondering when she  May resume taking.

## 2019-01-19 ENCOUNTER — Telehealth: Payer: Self-pay

## 2019-01-19 NOTE — Telephone Encounter (Signed)
Spoke with Brianna Curry following tumor board yesterday. Recommendation is for surgical evaluation. She is in agreement and referral has been sent to Dr. Mariah Milling with confirmation of receipt.

## 2019-01-23 ENCOUNTER — Telehealth: Payer: Self-pay

## 2019-01-23 NOTE — Telephone Encounter (Signed)
Spoke with Brianna Curry at [facility]. Brianna Curry has been scheduled to see Dr. Mariah Milling 01/30/19 at 1000. I have called and notified Brianna Curry with her appointment details. Address is [facility] Read back performed. They will be mailing her a packet of information regarding her appointment.

## 2019-01-30 DIAGNOSIS — Z1159 Encounter for screening for other viral diseases: Secondary | ICD-10-CM | POA: Diagnosis not present

## 2019-01-30 DIAGNOSIS — C220 Liver cell carcinoma: Secondary | ICD-10-CM | POA: Diagnosis not present

## 2019-01-31 ENCOUNTER — Telehealth: Payer: Self-pay

## 2019-01-31 NOTE — Telephone Encounter (Signed)
Brianna Curry has been evaluated by Dr. Mariah Milling with the following arranged.   Surgery: Robotic right hepatectomy Date of Surgery: Friday February 16, 2019 Location of Surgery: [facility] / [facility]  She will need follow up with Dr. Tasia Catchings  following surgery.

## 2019-02-16 DIAGNOSIS — J9811 Atelectasis: Secondary | ICD-10-CM | POA: Diagnosis not present

## 2019-02-16 DIAGNOSIS — D62 Acute posthemorrhagic anemia: Secondary | ICD-10-CM | POA: Diagnosis not present

## 2019-02-16 DIAGNOSIS — T884XXA Failed or difficult intubation, initial encounter: Secondary | ICD-10-CM | POA: Diagnosis not present

## 2019-02-16 DIAGNOSIS — R319 Hematuria, unspecified: Secondary | ICD-10-CM | POA: Diagnosis not present

## 2019-02-16 DIAGNOSIS — K219 Gastro-esophageal reflux disease without esophagitis: Secondary | ICD-10-CM | POA: Diagnosis not present

## 2019-02-16 DIAGNOSIS — K740 Hepatic fibrosis: Secondary | ICD-10-CM | POA: Diagnosis not present

## 2019-02-16 DIAGNOSIS — K760 Fatty (change of) liver, not elsewhere classified: Secondary | ICD-10-CM | POA: Diagnosis not present

## 2019-02-16 DIAGNOSIS — I959 Hypotension, unspecified: Secondary | ICD-10-CM | POA: Diagnosis not present

## 2019-02-16 DIAGNOSIS — E785 Hyperlipidemia, unspecified: Secondary | ICD-10-CM | POA: Diagnosis not present

## 2019-02-16 DIAGNOSIS — J95821 Acute postprocedural respiratory failure: Secondary | ICD-10-CM | POA: Diagnosis not present

## 2019-02-16 DIAGNOSIS — G8918 Other acute postprocedural pain: Secondary | ICD-10-CM | POA: Diagnosis not present

## 2019-02-16 DIAGNOSIS — J90 Pleural effusion, not elsewhere classified: Secondary | ICD-10-CM | POA: Diagnosis not present

## 2019-02-16 DIAGNOSIS — C220 Liver cell carcinoma: Secondary | ICD-10-CM | POA: Diagnosis not present

## 2019-02-16 DIAGNOSIS — I10 Essential (primary) hypertension: Secondary | ICD-10-CM | POA: Diagnosis not present

## 2019-02-16 DIAGNOSIS — J9600 Acute respiratory failure, unspecified whether with hypoxia or hypercapnia: Secondary | ICD-10-CM | POA: Diagnosis not present

## 2019-02-16 DIAGNOSIS — E872 Acidosis: Secondary | ICD-10-CM | POA: Diagnosis not present

## 2019-02-16 DIAGNOSIS — N390 Urinary tract infection, site not specified: Secondary | ICD-10-CM | POA: Diagnosis not present

## 2019-02-16 DIAGNOSIS — R918 Other nonspecific abnormal finding of lung field: Secondary | ICD-10-CM | POA: Diagnosis not present

## 2019-02-16 DIAGNOSIS — H409 Unspecified glaucoma: Secondary | ICD-10-CM | POA: Diagnosis not present

## 2019-02-16 DIAGNOSIS — T41205A Adverse effect of unspecified general anesthetics, initial encounter: Secondary | ICD-10-CM | POA: Diagnosis not present

## 2019-02-16 DIAGNOSIS — Z978 Presence of other specified devices: Secondary | ICD-10-CM | POA: Diagnosis not present

## 2019-02-23 ENCOUNTER — Telehealth: Payer: Self-pay

## 2019-02-23 NOTE — Telephone Encounter (Signed)
Discharged following [place]; RESECTION,LIVER on 02/22/19. She will need follow up with Dr. Tasia Catchings. Message sent to scheduling.

## 2019-02-27 ENCOUNTER — Telehealth: Payer: Self-pay

## 2019-02-27 DIAGNOSIS — C220 Liver cell carcinoma: Secondary | ICD-10-CM | POA: Diagnosis not present

## 2019-02-27 NOTE — Telephone Encounter (Signed)
Call placed to Brianna Curry. She has her follow up with Dr. Mariah Milling today. Her pathology has returned and he will review this in detail with her. She reports her recovery has been slow. She has her appointment with Dr. Tasia Catchings 3/17 at 1430. Encouraged her to call with any needs.

## 2019-03-13 ENCOUNTER — Inpatient Hospital Stay: Payer: Medicare HMO | Attending: Oncology | Admitting: Oncology

## 2019-03-13 ENCOUNTER — Inpatient Hospital Stay: Payer: Medicare HMO

## 2019-03-13 ENCOUNTER — Encounter: Payer: Self-pay | Admitting: Oncology

## 2019-03-13 ENCOUNTER — Other Ambulatory Visit: Payer: Self-pay

## 2019-03-13 VITALS — BP 115/78 | HR 78 | Temp 96.1°F | Resp 18 | Wt 164.1 lb

## 2019-03-13 DIAGNOSIS — I700 Atherosclerosis of aorta: Secondary | ICD-10-CM | POA: Diagnosis not present

## 2019-03-13 DIAGNOSIS — D751 Secondary polycythemia: Secondary | ICD-10-CM | POA: Insufficient documentation

## 2019-03-13 DIAGNOSIS — K219 Gastro-esophageal reflux disease without esophagitis: Secondary | ICD-10-CM | POA: Diagnosis not present

## 2019-03-13 DIAGNOSIS — I358 Other nonrheumatic aortic valve disorders: Secondary | ICD-10-CM | POA: Diagnosis not present

## 2019-03-13 DIAGNOSIS — R5383 Other fatigue: Secondary | ICD-10-CM

## 2019-03-13 DIAGNOSIS — C220 Liver cell carcinoma: Secondary | ICD-10-CM | POA: Insufficient documentation

## 2019-03-13 DIAGNOSIS — Z87440 Personal history of urinary (tract) infections: Secondary | ICD-10-CM

## 2019-03-13 DIAGNOSIS — R531 Weakness: Secondary | ICD-10-CM | POA: Insufficient documentation

## 2019-03-13 DIAGNOSIS — E785 Hyperlipidemia, unspecified: Secondary | ICD-10-CM

## 2019-03-13 DIAGNOSIS — Z79899 Other long term (current) drug therapy: Secondary | ICD-10-CM | POA: Insufficient documentation

## 2019-03-13 DIAGNOSIS — I10 Essential (primary) hypertension: Secondary | ICD-10-CM | POA: Diagnosis not present

## 2019-03-13 DIAGNOSIS — R102 Pelvic and perineal pain: Secondary | ICD-10-CM | POA: Insufficient documentation

## 2019-03-13 LAB — HEPATIC FUNCTION PANEL
ALT: 19 U/L (ref 0–44)
AST: 38 U/L (ref 15–41)
Albumin: 3.4 g/dL — ABNORMAL LOW (ref 3.5–5.0)
Alkaline Phosphatase: 56 U/L (ref 38–126)
BILIRUBIN DIRECT: 0.2 mg/dL (ref 0.0–0.2)
Indirect Bilirubin: 0.6 mg/dL (ref 0.3–0.9)
Total Bilirubin: 0.8 mg/dL (ref 0.3–1.2)
Total Protein: 7.9 g/dL (ref 6.5–8.1)

## 2019-03-13 NOTE — Progress Notes (Signed)
Hematology/Oncology Consult note [facility] [phone] [phone]   Patient Care Team: Guadalupe Maple, MD as PCP - General (Family Medicine) Jannet Mantis, MD (Dermatology) Karren Burly Deirdre Peer, MD as Referring Physician (Ophthalmology) Clent Jacks, RN as Registered Nurse  REFERRING PROVIDER: Zara Council REASON FOR VISIT:  Follow up for management of hepatocellular carcinoma.   HISTORY OF PRESENTING ILLNESS:  Brianna Curry is a  78 y.o.  female with PMH listed below who was referred to me for evaluation of liver mass.  Patient was recently referred to urology for recurrent urinary tract infections. Urine also showed hematuria.  As part of the hematuria work-up, patient underwent CT hematuria protocol as well as cystoscopy. CT hematuria work-up on 11/03/2018 showed large mass in right lobe of the liver, segment 7, measuring 5.6 x 7.5 x 7.7 cm No other acute findings noted in the abdomen or pelvis.  Patient has aortic atherosclerosis.  Calcifications of aortic valve and mitral annulus.  MRI liver with and without contrast was done on 12/11/2018 which showed 7.7 x 7.5 cm right hepatic lobe lesion worrisome for malignancy.  No other obvious primary seen in the abdomen or pelvis.  Favor primary hepatic cell neoplasm such as [place].  Recommend tissue biopsy.  Patient underwent cystoscopy on 11/15/2018 for evaluation of recurrent UTIs.  Cystoscopy was noted to be negative.  Patient was referred to cancer center for further evaluation and management. She was accompanied by sister and daughter today. Reports having suprapubic pain, chronic.  Denies any right upper quadrant pain or discomfort.   Denies any history of hepatitis, previous blood transfusion, jaundice, alcohol use history. She has also been referred to cardiologist for evaluation of aortic valve calcifications, mitral annulus. Denies any weight loss, fever, chills.   Appetite is good until she received news of possible cancer diagnosis.   History of melanoma.   # PET scan ndependent reviewed by me and discussed with patient.  No findings of distant metastatic disease. and biopsy of liver mass showed well-differentiated HCC Preop AFP elevated at 42.7.  Patient was referred to hepatobiliary surgery at [facility] for evaluation of resection.   INTERVAL HISTORY Brianna Curry is a 77 y.o. female who has above history reviewed by me today presents for follow up visit for management of [facility], s/p right partial hepatectomy on 02/16/2019 at [facility].  Pathology showed pT2NX moderate differentiated hepatocellular carcinoma, 7.7 cm with background of liver with mild steatosis.  Centrilobular pericellular fibrosis.  Margins negative.  Vascular invasion not identified.  Perineural invasion not identified. Patient reports still feeling weak, some "soreness" under right rib cage, " still recovering from surgery".  She is accompanied by her granddaughter.  Appetite is fair. She has lost 12 pounds since last visit.  She walks with granddaughter every days.   Review of Systems  Constitutional: Positive for fatigue. Negative for appetite change, chills and fever.  HENT:   Negative for hearing loss and voice change.   Eyes: Negative for eye problems.  Respiratory: Negative for chest tightness and cough.   Cardiovascular: Negative for chest pain.  Gastrointestinal: Negative for abdominal distention, abdominal pain and blood in stool.  Endocrine: Negative for hot flashes.  Genitourinary: Negative for difficulty urinating and frequency.   Musculoskeletal: Negative for arthralgias.  Skin: Negative for itching and rash.  Neurological: Negative for extremity weakness.  Hematological: Negative for adenopathy.  Psychiatric/Behavioral: Negative for confusion.    MEDICAL HISTORY:  Past Medical History:  Diagnosis Date  Anxiety    CAD (coronary artery disease)    Cancer (HCC)      melanomia   GERD (gastroesophageal reflux disease)    Glaucoma    Hyperlipidemia    Hypertension    IBS (irritable bowel syndrome)     SURGICAL HISTORY: Past Surgical History:  Procedure Laterality Date   COLONOSCOPY     NASAL SINUS SURGERY  1976    SOCIAL HISTORY: Social History   Socioeconomic History   Marital status: Single    Spouse name: Not on file   Number of children: 0   Years of education: Not on file   Highest education level: High school graduate  Occupational History   Not on file  Social Needs   Financial resource strain: Not hard at all   Food insecurity:    Worry: Never true    Inability: Never true   Transportation needs:    Medical: No    Non-medical: No  Tobacco Use   Smoking status: Never Smoker   Smokeless tobacco: Never Used  Substance and Sexual Activity   Alcohol use: No   Drug use: No   Sexual activity: Not on file  Lifestyle   Physical activity:    Days per week: 0 days    Minutes per session: 0 min   Stress: Not at all  Relationships   Social connections:    Talks on phone: More than three times a week    Gets together: More than three times a week    Attends religious service: More than 4 times per year    Active member of club or organization: No    Attends meetings of clubs or organizations: Never    Relationship status: Never married   Intimate partner violence:    Fear of current or ex partner: No    Emotionally abused: No    Physically abused: No    Forced sexual activity: No  Other Topics Concern   Not on file  Social History Narrative   Lives with brother     FAMILY HISTORY: Family History  Problem Relation Age of Onset   Hypertension Mother    Heart attack Mother    Hypertension Father    Heart attack Father    Prostate cancer Father    Glaucoma Maternal Grandmother    Heart attack Maternal Grandmother    Heart disease Maternal Grandmother    Hypertension Maternal  Grandmother    Stroke Paternal Grandmother    Stroke Paternal Grandfather     ALLERGIES:  is allergic to sulfa antibiotics; zithromax [azithromycin]; and penicillins.  MEDICATIONS:  Current Outpatient Medications  Medication Sig Dispense Refill   albuterol (PROVENTIL HFA;VENTOLIN HFA) 108 (90 Base) MCG/ACT inhaler Inhale 2 puffs into the lungs every 4 (four) hours as needed for wheezing or shortness of breath. 1 Inhaler 11   aspirin EC 81 MG tablet Take 81 mg by mouth daily.     bisoprolol-hydrochlorothiazide (ZIAC) 10-6.25 MG tablet Take 1 tablet by mouth 2 (two) times daily. 180 tablet 1   Docusate Calcium (STOOL SOFTENER PO) Take by mouth daily as needed.      fenofibrate 160 MG tablet TAKE 1 TABLET EVERY DAY 90 tablet 1   isosorbide mononitrate (IMDUR) 30 MG 24 hr tablet Take 30 mg by mouth daily.     latanoprost (XALATAN) 0.005 % ophthalmic solution      Multiple Vitamin (MULTIVITAMIN) tablet Take 1 tablet by mouth daily.     No current facility-administered  medications for this visit.      PHYSICAL EXAMINATION: ECOG PERFORMANCE STATUS: 0 - Asymptomatic Vitals:   03/13/19 1412  BP: 115/78  Pulse: 78  Resp: 18  Temp: (!) 96.1 F (35.6 C)   Filed Weights   03/13/19 1412  Weight: 164 lb 1.6 oz (74.4 kg)    Physical Exam Constitutional:      General: She is not in acute distress.    Comments: Frail appearance.   HENT:     Head: Normocephalic and atraumatic.  Eyes:     General: No scleral icterus.    Pupils: Pupils are equal, round, and reactive to light.  Neck:     Musculoskeletal: Normal range of motion and neck supple.  Cardiovascular:     Rate and Rhythm: Normal rate and regular rhythm.     Heart sounds: Normal heart sounds.  Pulmonary:     Effort: Pulmonary effort is normal. No respiratory distress.     Breath sounds: No wheezing.  Abdominal:     General: Bowel sounds are normal. There is no distension.     Palpations: Abdomen is soft. There is  no mass.     Tenderness: There is no abdominal tenderness.     Comments: laparoscopic incision sites healing well.   Musculoskeletal: Normal range of motion.        General: No deformity.  Skin:    General: Skin is warm and dry.     Findings: No erythema or rash.  Neurological:     Mental Status: She is alert and oriented to person, place, and time.     Cranial Nerves: No cranial nerve deficit.     Coordination: Coordination normal.  Psychiatric:        Behavior: Behavior normal.        Thought Content: Thought content normal.      LABORATORY DATA:  I have reviewed the data as listed Lab Results  Component Value Date   WBC 5.7 01/12/2019   HGB 14.6 01/12/2019   HCT 45.7 01/12/2019   MCV 92.5 01/12/2019   PLT 255 01/12/2019   Recent Labs    08/14/18 1106 11/03/18 1305 12/14/18 0936  NA 142  --  137  K 4.4  --  3.8  CL 102  --  101  CO2 22  --  26  GLUCOSE 107*  --  130*  BUN 15  --  15  CREATININE 0.92 0.80 0.80  CALCIUM 10.3  --  9.7  GFRNONAA 60  --  >60  GFRAA 69  --  >60  PROT 8.0  --  9.7*  ALBUMIN 4.8  --  5.1*  AST 73*  --  48*  ALT 51*  --  32  ALKPHOS 32*  --  31*  BILITOT 0.6  --  0.9   12/14/2018 AFP 42.7  RADIOGRAPHIC STUDIES: I have personally reviewed the radiological images as listed and agreed with the findings in the report.  11/03/2018 CT hematuria work-up . Large complex mass centered in the right lobe of the liver. This does have imaging characteristics that may suggest a cavernous hemangioma, however, this is not certain on today's CT examination. Further characterization with nonemergent MRI of the abdomen with and without IV gadolinium is strongly recommended in the near future to better characterize this finding, as neoplasm is not excluded. 2. No acute findings are noted in the abdomen or pelvis to account for the patient's symptoms. 3. No findings to account for the patient's  history of hematuria. 4. Aortic atherosclerosis.5. There  are calcifications of the aortic valve and mitral annulus.Echocardiographic correlation for evaluation of potential valvular dysfunction may be warranted if clinically indicated  12/11/2018 MRI liver with and without contrast 1. 7.7 x 7.5 cm right hepatic lobe lesion worrisome for malignancy.Although this could be a metastasis I do not see any obvious primary. I would favor a primary hepatic neoplasm such as [place].Recommend tissue biopsy. 2. No other significant abdominal findings. No adenopathy or acute abdominal process.  PET scan 12/21/2018 1. The posterior right hepatic lobe mass has a maximum SUV of 7.0, compatible with malignancy and particularly worrisome for hepatocellular carcinoma. Probable central necrosis. Tissue diagnosis recommended. 2. No findings of metastatic spread. 3.  Aortic Atherosclerosis (ICD10-I70.0).  ASSESSMENT & PLAN:  1. Hepatocellular carcinoma ([place])   2. 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 Pathology was reviewed and discussed with patient.  Discussed with patient and family that no adjuvant chemotherapy has been shown to be beneficial, outside of clinical trial setting. Patient is not interested in enrolling trial at [facility].  NCCN guideline of surveillance discussed with patient  Recommend obtaining post operative tumor marker AFP today.  She had labs done at [facility] when she followed up with surgery on 02/27/2019. Labs reviewed.  Bilirubin 1.8, will repeat liver function today.   Imaging -MRI liver w/wo every 4 months for 2-3 years and then every six months. Next MRI liver in June 2020 AFP every 4 months for 2-3 years and then every six months.   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   Return of visit: 3 month after MRI Liver, lab and MD assessment.    Earlie Server, MD, PhD Hematology Oncology [facility] at [facility] Pager- [phone] 03/13/2019

## 2019-03-13 NOTE — Progress Notes (Signed)
Patient here for follow up. Niece, concerned that patient has poor appetite and has not been eating well.

## 2019-03-14 LAB — AFP TUMOR MARKER: AFP, Serum, Tumor Marker: 4 ng/mL (ref 0.0–8.3)

## 2019-03-19 ENCOUNTER — Ambulatory Visit: Payer: Medicare PPO | Admitting: Family Medicine

## 2019-03-21 DIAGNOSIS — R001 Bradycardia, unspecified: Secondary | ICD-10-CM | POA: Diagnosis not present

## 2019-03-21 DIAGNOSIS — R000 Tachycardia, unspecified: Secondary | ICD-10-CM | POA: Diagnosis not present

## 2019-03-21 DIAGNOSIS — I499 Cardiac arrhythmia, unspecified: Secondary | ICD-10-CM | POA: Diagnosis not present

## 2019-03-22 ENCOUNTER — Telehealth: Payer: Self-pay | Admitting: Family Medicine

## 2019-03-22 NOTE — Telephone Encounter (Signed)
Copied from CRM #[id number]. Topic: General - Deceased Patient >> Mar 21, 2019 12:40 PM Taylor, Brittany L wrote: Reason for CRM: Corporal Bateman with [place] [employer] called and said she passed away about 5 mins ago and he would like to know if Dr Kendric Sindelar will sign the death certificate. He wants Dr Nyeshia Mysliwiec to call him @ [phone] Route to department's PEC Pool. >> Mar 22, 2019  8:17 AM Gor Vestal A, MD wrote: Discussed with Sheriff department and will sign death certificate. 

## 2019-03-26 ENCOUNTER — Telehealth: Payer: Self-pay | Admitting: Family Medicine

## 2019-03-26 NOTE — Telephone Encounter (Signed)
Copied from [place] [id number]. Topic: General - Deceased Patient >> 2019-04-01 12:40 PM Vernona Rieger wrote: Reason for CRM: Corporal Bateman with [place] called and said she passed away about 5 mins ago and he would like to know if Dr Jeananne Rama will sign the death certificate. He wants Dr Jeananne Rama to call him @ [phone] Route to department's [facility] Pool. >> Mar 22, 2019  8:17 AM Guadalupe Maple, MD wrote: Discussed with [place] and will sign death certificate.

## 2019-03-26 NOTE — Telephone Encounter (Signed)
Copied from CRM #[id number]. Topic: General - Deceased Patient >> Mar 21, 2019 12:40 PM Taylor, Brittany L wrote: Reason for CRM: Corporal Bateman with [place] [employer] called and said she passed away about 5 mins ago and he would like to know if Dr Abundio Teuscher will sign the death certificate. He wants Dr Kaleena Corrow to call him @ [phone] Route to department's PEC Pool. >> Mar 22, 2019  8:17 AM Emari Demmer A, MD wrote: Discussed with Sheriff department and will sign death certificate. 

## 2019-03-26 NOTE — Telephone Encounter (Signed)
We will sign death certificate

## 2019-03-28 DEATH — deceased

## 2019-06-08 ENCOUNTER — Ambulatory Visit: Payer: Medicare HMO

## 2019-06-11 ENCOUNTER — Ambulatory Visit: Payer: Medicare HMO | Admitting: Oncology

## 2019-06-11 ENCOUNTER — Other Ambulatory Visit: Payer: Medicare HMO

## 2019-08-23 ENCOUNTER — Telehealth: Payer: Self-pay | Admitting: Family Medicine

## 2019-08-23 NOTE — Chronic Care Management (AMB) (Signed)
°  Chronic Care Management   Outreach Note  08/23/2019 Name: CAELEE EMMERT MRN: [id number] DOB: 1941/07/11  Referred by: Guadalupe Maple, MD Reason for referral : Chronic Care Management (CCM )   Patient was referred for care management engagement. In preparation for telephonic outreach, it was noted that the patient is deceased. The primary care provider has been notified of this information. No further outreach on the part of the embedded care management team will be made.   Follow Up Plan: No further follow up required.  [place]  [email or web address]   ??[id number]

## 2020-07-09 IMAGING — MR MR ABDOMEN WO/W CM
20 series · 47 of 48 positions shown · IV contrast (Contrast agent)
Comparison: CT scan 11/13/2018

CLINICAL DATA: Evaluate liver lesion seen on recent CT scan.

EXAM:
MRI ABDOMEN WITHOUT AND WITH CONTRAST
TECHNIQUE: Multiplanar multisequence MR imaging of the abdomen was performed
both before and after the administration of intravenous contrast.
CONTRAST:  8 cc Gadavist

[Series 6: T2 fat-sat · axial · 6.0mm · 1.19mm/px · z∈[-18,+191]mm · 2 of 30 slices shown]
[im 1/30]
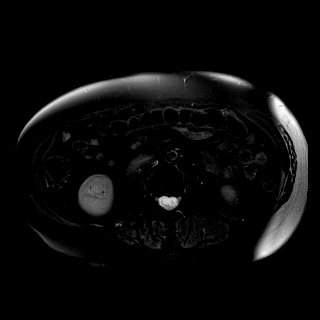
[im 30/30]
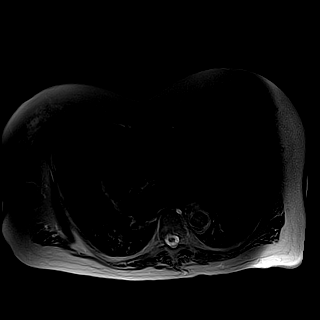

[Series 7: ax dwi_tracew · axial · 6.0mm · 1.42mm/px · z∈[-18,+191]mm · 2 of 30 slices shown (1 of 3)]
[im 1/30]
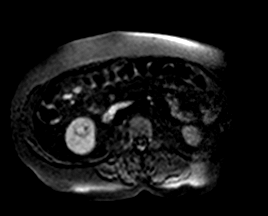
[im 30/30]
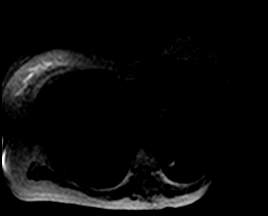

[Series 7: ax dwi_tracew · axial · 6.0mm · 1.42mm/px · z∈[-18,+191]mm · 2 of 30 slices shown (2 of 3)]
[im 1/30]
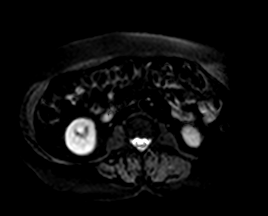
[im 30/30]
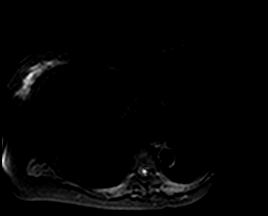

[Series 7: ax dwi_tracew · axial · 6.0mm · 1.42mm/px · z∈[-18,+191]mm · 2 of 30 slices shown (3 of 3)]
[im 1/30]
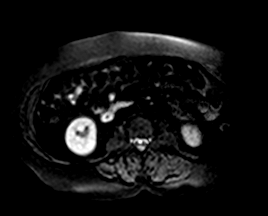
[im 30/30]
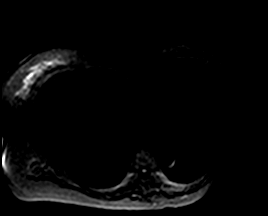

[Series 8: ax dwi_adc · axial · 6.0mm · 1.42mm/px · z∈[-18,+191]mm · 2 of 30 slices shown]
[im 1/30]
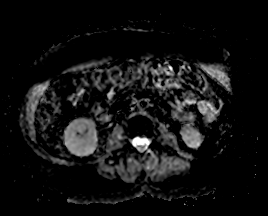
[im 30/30]
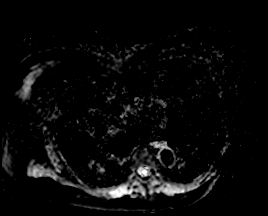

[Series 9: T2 · coronal · 6.0mm · 1.19mm/px · 2 of 30 slices shown (1 of 2)]
[im 1/30]
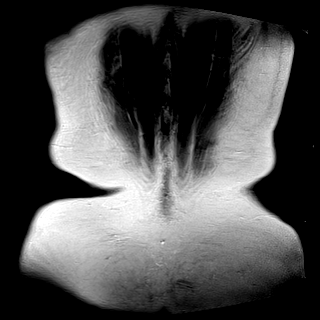
[im 30/30]
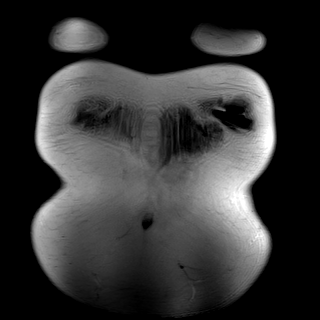

[Series 10: T1 · axial · 6.0mm · 0.74mm/px · z∈[-56,+153]mm · 2 of 30 slices shown (1 of 2)]
[im 1/30]
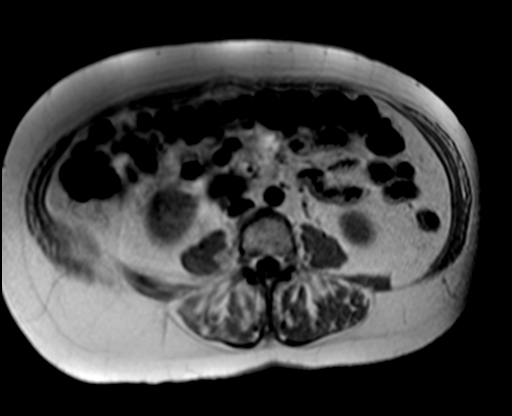
[im 30/30]
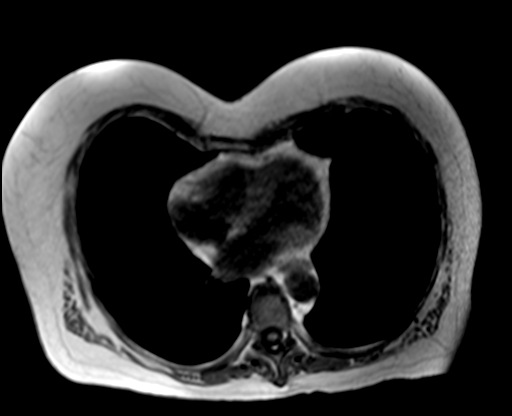

[Series 10: T1 · axial · 6.0mm · 0.74mm/px · z∈[-56,+153]mm · 2 of 30 slices shown (2 of 2)]
[im 1/30]
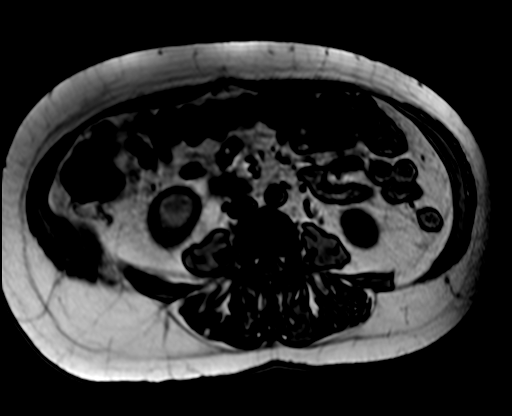
[im 30/30]
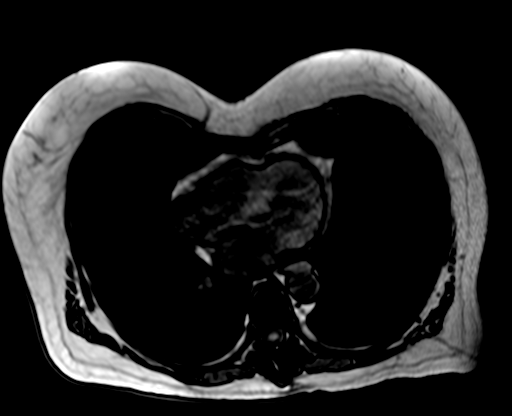

[Series 11: T2 · axial · 6.0mm · 1.19mm/px · 1 of 30 slices shown (2 of 2)]
[im 1/30]
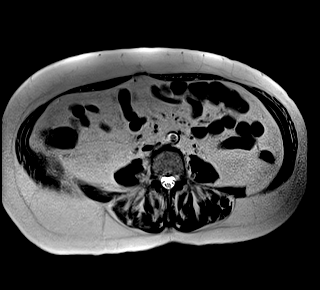

[Series 12: bSSFP · axial · 6.0mm · 0.74mm/px · 1 of 30 slices shown]
[im 1/30]
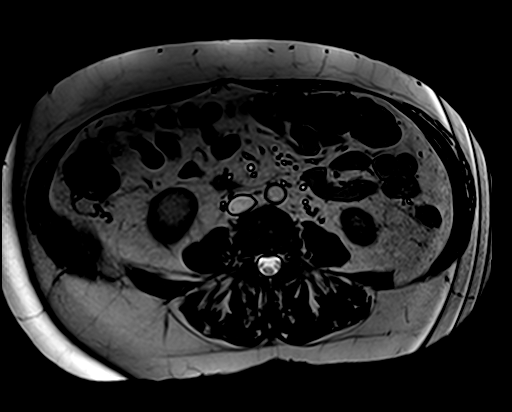

[Series 16: T1 dynamic fat-sat · axial · non-contrast · 3.5mm · 1.19mm/px · z∈[-76,+173]mm · 3 of 72 slices shown]
[im 1/72]
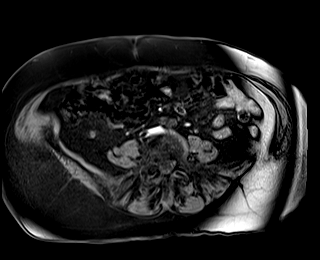
[im 36/72]
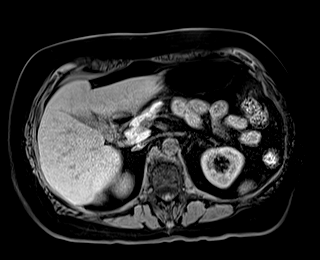
[im 72/72]
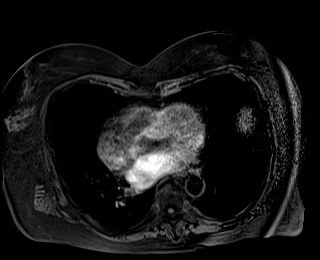

[Series 17: T1 dynamic fat-sat post-contrast · axial · 3.5mm · 1.19mm/px · z∈[-76,+173]mm · 3 of 72 slices shown (1 of 8)]
[im 1/72]
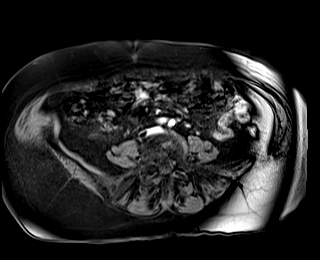
[im 36/72]
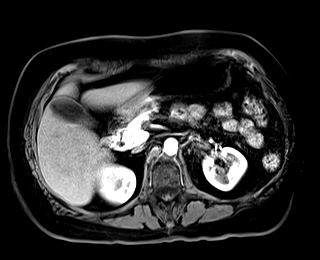
[im 72/72]
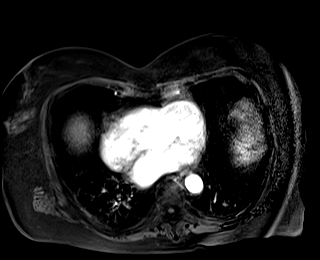

[Series 18: T1 dynamic fat-sat post-contrast · axial · 3.5mm · 1.19mm/px · z∈[-76,+173]mm · 3 of 72 slices shown (2 of 8)]
[im 1/72]
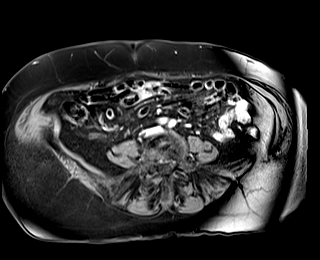
[im 36/72]
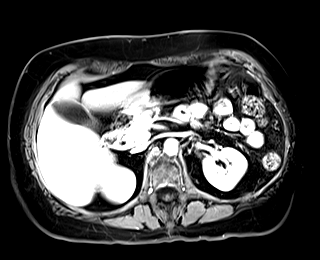
[im 72/72]
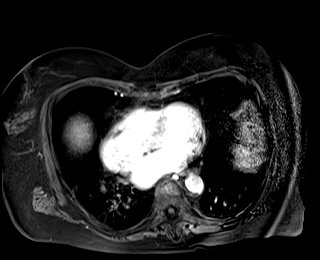

[Series 19: T1 dynamic fat-sat post-contrast · axial · 3.5mm · 1.19mm/px · z∈[-76,+173]mm · 3 of 72 slices shown (3 of 8)]
[im 1/72]
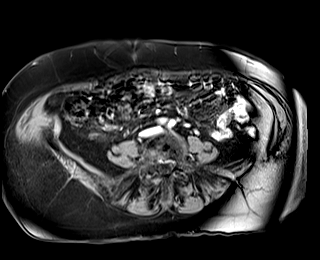
[im 36/72]
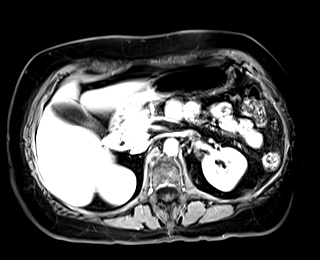
[im 72/72]
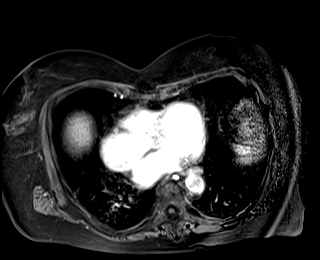

[Series 20: T1 dynamic post-contrast · coronal · 3.0mm · 1.31mm/px · 3 of 72 slices shown]
[im 1/72]
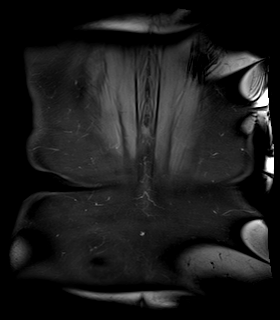
[im 36/72]
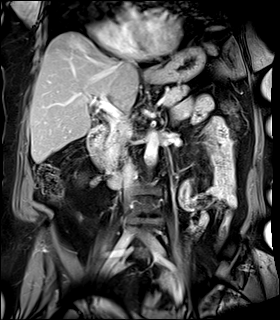
[im 72/72]
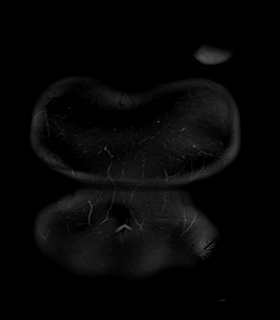

[Series 21: T1 dynamic fat-sat post-contrast · axial · 3.5mm · 1.19mm/px · z∈[-76,+173]mm · 3 of 72 slices shown (4 of 8)]
[im 1/72]
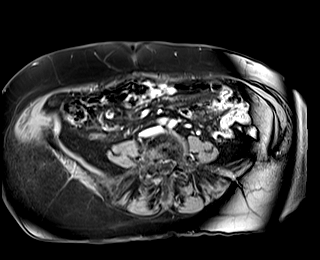
[im 36/72]
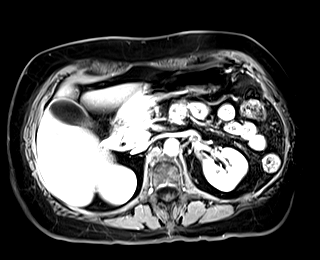
[im 72/72]
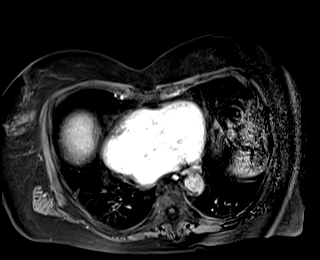

[Series 1002: T1 dynamic fat-sat post-contrast · axial · 3.5mm · 1.19mm/px · z∈[-76,+173]mm · 3 of 72 slices shown (5 of 8)]
[im 1/72]
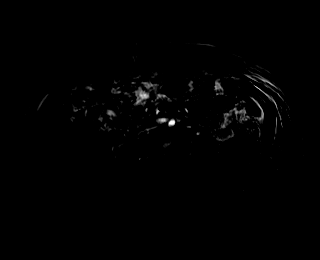
[im 36/72]
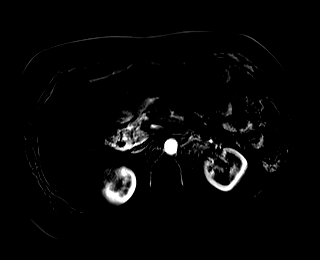
[im 72/72]
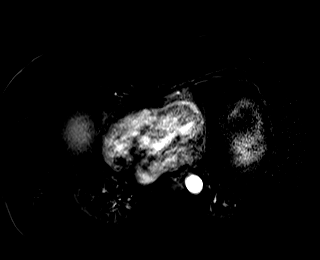

[Series 1003: T1 dynamic fat-sat post-contrast · axial · 3.5mm · 1.19mm/px · z∈[-76,+173]mm · 3 of 72 slices shown (6 of 8)]
[im 1/72]
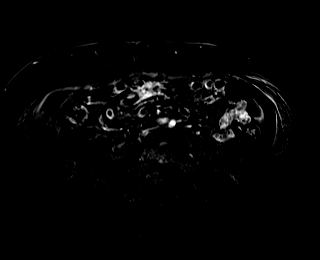
[im 36/72]
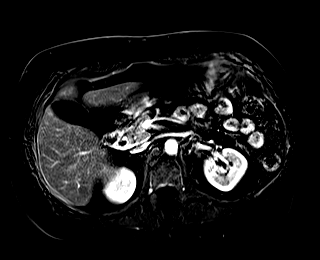
[im 72/72]
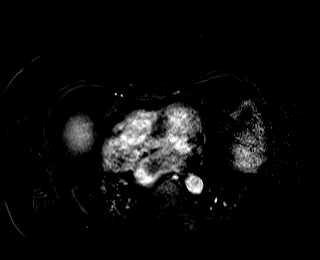

[Series 1004: T1 dynamic fat-sat post-contrast · axial · 3.5mm · 1.19mm/px · z∈[-76,+173]mm · 3 of 72 slices shown (7 of 8)]
[im 1/72]
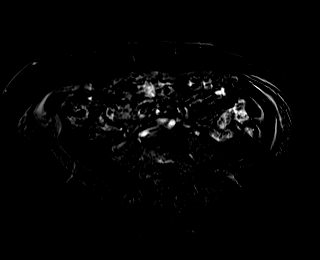
[im 36/72]
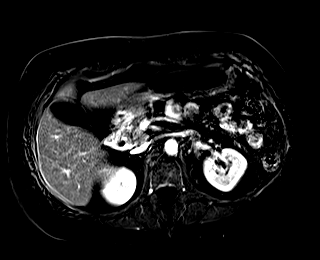
[im 72/72]
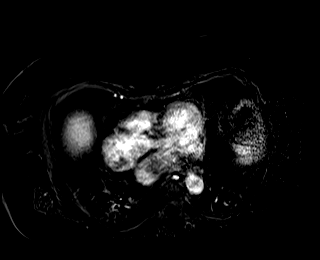

[Series 1005: T1 dynamic fat-sat post-contrast · axial · 3.5mm · 1.19mm/px · z∈[-76,+47]mm · 2 of 72 slices shown (8 of 8)]
[im 1/72]
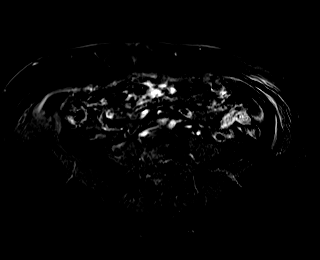
[im 36/72]
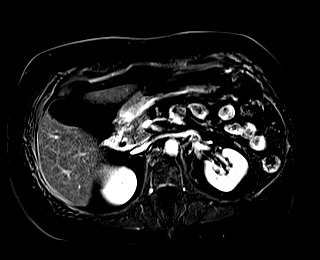

[47 of 48 positions shown; findings below may reference images not displayed]

FINDINGS: Lower chest: The lung bases are grossly clear. No pulmonary lesions.
No pleural or pericardial effusion. Significant pectus deformity.

Hepatobiliary: As demonstrated on the CT scan there is a large right
hepatic lobe lesion occupying segment 7 and measuring approximately
7.7 x 7.5 cm. It has heterogeneous increased T2 signal intensity and
areas of increased T1 signal intensity which could suggest
hemorrhage. Following contrast administration there is very
heterogeneous contrast enhancement in a large central scar or
necrosis. Is also an enhancing rim around the lesion. This is very
worrisome for malignancy. In retrospect I do not see an on prior
ultrasounds from 9789 or 2775. It could be a metastatic focus
although I do not see any findings in the abdomen or pelvis to
suggest this. There is no adenopathy. HCC is a strong possibility
and recommend biopsy.

No other hepatic lesions. No intra or extrahepatic biliary
dilatation. The portal and hepatic veins are patent. The gallbladder
appears normal.

Pancreas:  No mass, inflammation or ductal dilatation.

Spleen:  Normal size.  No focal lesions.

Adrenals/Urinary Tract:  The adrenal glands and kidneys are normal.

Stomach/Bowel: Visualized portions within the abdomen are
unremarkable.

Vascular/Lymphatic: No pathologically enlarged lymph nodes
identified. No abdominal aortic aneurysm demonstrated.

Other:  No ascites or abdominal wall hernia.

Musculoskeletal: No significant bony findings.
IMPRESSION: 1. 7.7 x 7.5 cm right hepatic lobe lesion worrisome for malignancy.
Although this could be a metastasis I do not see any obvious
primary. I would favor a primary hepatic neoplasm such as HCC.
Recommend tissue biopsy.
2. No other significant abdominal findings. No adenopathy or acute
abdominal process.
